# Patient Record
Sex: Female | Born: 1945 | Race: White | Hispanic: No | Marital: Married | Smoking: Never smoker
Health system: Southern US, Community
[De-identification: ages and names within clinical notes are randomized; demographics above are authoritative.]

## PROBLEM LIST (undated history)

## (undated) DIAGNOSIS — E039 Hypothyroidism, unspecified: Secondary | ICD-10-CM

## (undated) DIAGNOSIS — I10 Essential (primary) hypertension: Secondary | ICD-10-CM

## (undated) DIAGNOSIS — E079 Disorder of thyroid, unspecified: Secondary | ICD-10-CM

## (undated) DIAGNOSIS — K219 Gastro-esophageal reflux disease without esophagitis: Secondary | ICD-10-CM

## (undated) DIAGNOSIS — C50911 Malignant neoplasm of unspecified site of right female breast: Secondary | ICD-10-CM

## (undated) DIAGNOSIS — G4730 Sleep apnea, unspecified: Secondary | ICD-10-CM

## (undated) HISTORY — DX: Disorder of thyroid, unspecified: E07.9

## (undated) HISTORY — PX: TUBAL LIGATION: SHX77

## (undated) HISTORY — PX: BLADDER SURGERY: SHX569

## (undated) HISTORY — PX: CHOLECYSTECTOMY: SHX55

## (undated) HISTORY — PX: UMBILICAL HERNIA REPAIR: SHX196

## (undated) HISTORY — PX: PARTIAL HYSTERECTOMY: SHX80

## (undated) HISTORY — DX: Essential (primary) hypertension: I10

## (undated) HISTORY — DX: Gastro-esophageal reflux disease without esophagitis: K21.9

---

## 2001-07-27 ENCOUNTER — Ambulatory Visit (HOSPITAL_COMMUNITY): Admission: RE | Admit: 2001-07-27 | Discharge: 2001-07-27 | Payer: Self-pay | Admitting: Internal Medicine

## 2001-07-27 ENCOUNTER — Encounter: Payer: Self-pay | Admitting: Internal Medicine

## 2001-08-01 ENCOUNTER — Other Ambulatory Visit: Admission: RE | Admit: 2001-08-01 | Discharge: 2001-08-01 | Payer: Self-pay | Admitting: Internal Medicine

## 2002-12-18 ENCOUNTER — Encounter: Payer: Self-pay | Admitting: Internal Medicine

## 2002-12-18 ENCOUNTER — Ambulatory Visit (HOSPITAL_COMMUNITY): Admission: RE | Admit: 2002-12-18 | Discharge: 2002-12-18 | Payer: Self-pay | Admitting: Internal Medicine

## 2003-02-12 ENCOUNTER — Ambulatory Visit (HOSPITAL_COMMUNITY): Admission: RE | Admit: 2003-02-12 | Discharge: 2003-02-12 | Payer: Self-pay | Admitting: Internal Medicine

## 2003-03-12 ENCOUNTER — Encounter (INDEPENDENT_AMBULATORY_CARE_PROVIDER_SITE_OTHER): Payer: Self-pay | Admitting: Internal Medicine

## 2003-03-12 ENCOUNTER — Ambulatory Visit (HOSPITAL_COMMUNITY): Admission: RE | Admit: 2003-03-12 | Discharge: 2003-03-12 | Payer: Self-pay | Admitting: Internal Medicine

## 2003-12-13 ENCOUNTER — Other Ambulatory Visit: Admission: RE | Admit: 2003-12-13 | Discharge: 2003-12-13 | Payer: Self-pay | Admitting: Unknown Physician Specialty

## 2003-12-20 ENCOUNTER — Ambulatory Visit (HOSPITAL_COMMUNITY): Admission: RE | Admit: 2003-12-20 | Discharge: 2003-12-20 | Payer: Self-pay | Admitting: Internal Medicine

## 2004-01-03 ENCOUNTER — Ambulatory Visit (HOSPITAL_COMMUNITY): Admission: RE | Admit: 2004-01-03 | Discharge: 2004-01-03 | Payer: Self-pay | Admitting: Internal Medicine

## 2004-02-13 ENCOUNTER — Ambulatory Visit (HOSPITAL_COMMUNITY): Admission: RE | Admit: 2004-02-13 | Discharge: 2004-02-13 | Payer: Self-pay | Admitting: Neurology

## 2004-12-23 ENCOUNTER — Ambulatory Visit (HOSPITAL_COMMUNITY): Admission: RE | Admit: 2004-12-23 | Discharge: 2004-12-23 | Payer: Self-pay | Admitting: Internal Medicine

## 2005-12-28 ENCOUNTER — Ambulatory Visit (HOSPITAL_COMMUNITY): Admission: RE | Admit: 2005-12-28 | Discharge: 2005-12-28 | Payer: Self-pay | Admitting: Internal Medicine

## 2006-05-24 ENCOUNTER — Emergency Department (HOSPITAL_COMMUNITY): Admission: EM | Admit: 2006-05-24 | Discharge: 2006-05-24 | Payer: Self-pay | Admitting: Emergency Medicine

## 2007-01-03 ENCOUNTER — Ambulatory Visit (HOSPITAL_COMMUNITY): Admission: RE | Admit: 2007-01-03 | Discharge: 2007-01-03 | Payer: Self-pay | Admitting: Internal Medicine

## 2007-01-19 ENCOUNTER — Ambulatory Visit (HOSPITAL_COMMUNITY): Admission: RE | Admit: 2007-01-19 | Discharge: 2007-01-19 | Payer: Self-pay | Admitting: Internal Medicine

## 2008-01-19 ENCOUNTER — Ambulatory Visit (HOSPITAL_COMMUNITY): Admission: RE | Admit: 2008-01-19 | Discharge: 2008-01-19 | Payer: Self-pay | Admitting: Internal Medicine

## 2008-03-09 ENCOUNTER — Ambulatory Visit (HOSPITAL_COMMUNITY): Admission: RE | Admit: 2008-03-09 | Discharge: 2008-03-09 | Payer: Self-pay | Admitting: General Surgery

## 2009-01-30 ENCOUNTER — Ambulatory Visit (HOSPITAL_COMMUNITY): Admission: RE | Admit: 2009-01-30 | Discharge: 2009-01-30 | Payer: Self-pay | Admitting: Internal Medicine

## 2009-02-05 ENCOUNTER — Ambulatory Visit (HOSPITAL_COMMUNITY): Admission: RE | Admit: 2009-02-05 | Discharge: 2009-02-05 | Payer: Self-pay | Admitting: Internal Medicine

## 2010-02-03 ENCOUNTER — Ambulatory Visit (HOSPITAL_COMMUNITY): Admission: RE | Admit: 2010-02-03 | Discharge: 2010-02-03 | Payer: Self-pay | Admitting: Internal Medicine

## 2011-01-04 ENCOUNTER — Encounter: Payer: Self-pay | Admitting: Neurology

## 2011-02-04 ENCOUNTER — Other Ambulatory Visit (HOSPITAL_COMMUNITY): Payer: Self-pay | Admitting: Internal Medicine

## 2011-02-04 DIAGNOSIS — Z139 Encounter for screening, unspecified: Secondary | ICD-10-CM

## 2011-02-06 ENCOUNTER — Ambulatory Visit (HOSPITAL_COMMUNITY)
Admission: RE | Admit: 2011-02-06 | Discharge: 2011-02-06 | Disposition: A | Discharge status: Home / Self Care | Payer: BC Managed Care – PPO | Source: Ambulatory Visit | Attending: Internal Medicine | Admitting: Internal Medicine

## 2011-02-06 DIAGNOSIS — Z1231 Encounter for screening mammogram for malignant neoplasm of breast: Secondary | ICD-10-CM | POA: Insufficient documentation

## 2011-02-06 DIAGNOSIS — Z139 Encounter for screening, unspecified: Secondary | ICD-10-CM

## 2011-03-24 ENCOUNTER — Other Ambulatory Visit (HOSPITAL_COMMUNITY): Payer: Self-pay | Admitting: Internal Medicine

## 2011-03-24 ENCOUNTER — Ambulatory Visit (HOSPITAL_COMMUNITY)
Admission: RE | Admit: 2011-03-24 | Discharge: 2011-03-24 | Disposition: A | Discharge status: Home / Self Care | Payer: BC Managed Care – PPO | Source: Ambulatory Visit | Attending: Internal Medicine | Admitting: Internal Medicine

## 2011-03-24 DIAGNOSIS — M25569 Pain in unspecified knee: Secondary | ICD-10-CM

## 2011-04-28 NOTE — H&P (Signed)
[name], [name]                  ACCOUNT NO.:  [id number]   MEDICAL RECORD NO.:  [id number]          PATIENT TYPE:  AMB   LOCATION:  DAY                           FACILITY:  [facility]   PHYSICIAN:  Dalia Heading, M.D.  DATE OF BIRTH:  21-Jul-1946   DATE OF ADMISSION:  DATE OF DISCHARGE:  LH                              HISTORY & PHYSICAL   CHIEF COMPLAINT:  Incisional hernia.   HISTORY OF PRESENT ILLNESS:  The patient is a 65 year old white female  who is referred for evaluation and treatment of lower abdominal pain.  It has been occurring over the past few months.  It was made worse with  movement and coughing.  She seems to catch herself towards the right  lower portion of the abdomen, extending over to the umbilicus.  She has  had two previous surgeries through the umbilicus.   PAST MEDICAL HISTORY:  Includes:  1. Hypothyroidism.  2. Hypertension.  3. High cholesterol levels.   PAST SURGICAL HISTORY:  1. Partial hysterectomy.  2. Laparoscopic cholecystectomy.  3. Bladder tack up.  4. Tubal ligation.   CURRENT MEDICATIONS:  1. Omeprazole.  2. Synthroid.  3. Lisinopril.  4. Nabumetone.  5. Lovastatin.  6. Biotin.  7. Magnesium.  8. Baby aspirin which she is holding.   ALLERGIES:  CIPROFLOXACIN, ANCEF, PROPOFOL.   REVIEW OF SYSTEMS:  The patient denies drinking or smoking.  She denies  any recent chest pain, MI, CVA, or diabetes mellitus.   On physical examination, the patient is a well-developed, well-nourished  white female in no acute distress.  LUNGS:  Clear to auscultation with equal breath sounds bilaterally.  HEART EXAMINATION:  Reveals regular rate and rhythm without S3, S4, or  murmurs.  ABDOMEN:  Is soft, nontender, nondistended.  No hepatosplenomegaly or  masses are noted.  An umbilical hernia which is reducible is noted and  reproduces her pain.  No other hernias are noted.   IMPRESSION:  Incisional hernia, umbilical.   PLAN:  The patient is scheduled  for incisional herniorrhaphy with mesh  on March 09, 2008.  Risks and benefits of the procedure including  bleeding, infection, and recurrence of the hernia were fully explained  to the patient, who gave informed consent.      Dalia Heading, M.D.  Electronically Signed     MAJ/MEDQ  D:  02/28/2008  T:  02/29/2008  Job:  [id number]   cc:   [facility]  Fax: [phone]   Dalia Heading, M.D.  Fax: [phone]   Kingsley Callander. Ouida Sills, MD  Fax: [phone]

## 2011-04-28 NOTE — H&P (Signed)
[name], [name]                  ACCOUNT NO.:  [id number]   MEDICAL RECORD NO.:  [id number]          PATIENT TYPE:  AMB   LOCATION:  DAY                           FACILITY:  [facility]   PHYSICIAN:  Dalia Heading, M.D.  DATE OF BIRTH:  1946-01-19   DATE OF ADMISSION:  DATE OF DISCHARGE:  LH                              HISTORY & PHYSICAL   CHIEF COMPLAINT:  Incisional hernia.   HISTORY OF PRESENT ILLNESS:  The patient is a 65 year old white female  who is referred for evaluation and treatment of lower abdominal pain.  It has been occurring over the past few months.  It was made worse with  movement and coughing.  She seems to catch herself towards the right  lower portion of the abdomen, extending over to the umbilicus.  She has  had two previous surgeries through the umbilicus.   PAST MEDICAL HISTORY:  Includes:  1. Hypothyroidism.  2. Hypertension.  3. High cholesterol levels.   PAST SURGICAL HISTORY:  1. Partial hysterectomy.  2. Laparoscopic cholecystectomy.  3. Bladder tack up.  4. Tubal ligation.   CURRENT MEDICATIONS:  1. Omeprazole.  2. Synthroid.  3. Lisinopril.  4. Nabumetone.  5. Lovastatin.  6. Biotin.  7. Magnesium.  8. Baby aspirin which she is holding.   ALLERGIES:  CIPROFLOXACIN, ANCEF, PROPOFOL.   REVIEW OF SYSTEMS:  The patient denies drinking or smoking.  She denies  any recent chest pain, MI, CVA, or diabetes mellitus.   On physical examination, the patient is a well-developed, well-nourished  white female in no acute distress.  LUNGS:  Clear to auscultation with equal breath sounds bilaterally.  HEART EXAMINATION:  Reveals regular rate and rhythm without S3, S4, or  murmurs.  ABDOMEN:  Is soft, nontender, nondistended.  No hepatosplenomegaly or  masses are noted.  An umbilical hernia which is reducible is noted and  reproduces her pain.  No other hernias are noted.   IMPRESSION:  Incisional hernia, umbilical.   PLAN:  The patient is scheduled  for incisional herniorrhaphy with mesh  on March 09, 2008.  Risks and benefits of the procedure including  bleeding, infection, and recurrence of the hernia were fully explained  to the patient, who gave informed consent.      Dalia Heading, M.D.  Electronically Signed     MAJ/MEDQ  D:  02/28/2008  T:  02/29/2008  Job:  [id number]   cc:   [facility]  Fax: [phone]   Dalia Heading, M.D.  Fax: [phone]   Kingsley Callander. Ouida Sills, MD  Fax: [phone]

## 2011-04-28 NOTE — Op Note (Signed)
[name], [name]                  ACCOUNT NO.:  [id number]   MEDICAL RECORD NO.:  [id number]          PATIENT TYPE:  AMB   LOCATION:  DAY                           FACILITY:  [facility]   PHYSICIAN:  Dalia Heading, M.D.  DATE OF BIRTH:  12-Feb-1946   DATE OF PROCEDURE:  03/09/2008  DATE OF DISCHARGE:                               OPERATIVE REPORT   PREOPERATIVE DIAGNOSIS:  Incisional hernia.   POSTOPERATIVE DIAGNOSIS:  Incisional hernia.   PROCEDURE:  Incisional herniorrhaphy with mesh.   SURGEON:  Dalia Heading, M.D.   ANESTHESIA:  General endotracheal.   INDICATIONS:  The patient is a 65 year old white female who presents  with an umbilical hernia due to multiple surgeries performed through  this area.  The risks and benefits of the procedure including bleeding,  infection and recurrence of the hernia were fully explained to the  patient, who gave informed consent.   PROCEDURE NOTE:  The patient was placed in the supine position.  After  induction of general endotracheal anesthesia, the abdomen was prepped  and draped using the usual sterile technique with Betadine.  Surgical  site confirmation was performed.   An infraumbilical incision was made down to the fascia.  The umbilicus  was freed away from the underlying fascia.  Omentum was noted to be  incarcerated in the hernia sac.  The hernia sac along with some omentum  was excised without difficulty.  The defect ultimately was approximately  1-1.5 cm in its greatest diameter.  The anterior abdominal wall was  checked in this region and no other hernias were noted.  A small  Ventralex patch was then placed into this region and secured to the  fascia using 2-0 Novofil interrupted sutures.  The base of the umbilicus  was secured back to the fascia using a 2-0 Vicryl interrupted suture.  The subcutaneous layer was reapproximated using a 3-0 Vicryl interrupted  suture.  The skin was closed using a 4-0 Vicryl subcuticular  suture.  Sensorcaine 0.5% was instilled into the surrounding wound.  Dermabond  was then applied.   All tape and needle counts were correct at the end of the procedure.  The patient was extubated in the operating room and went back to the  recovery room awake, in stable condition.   COMPLICATIONS:  None.   SPECIMEN:  None.   BLOOD LOSS:  Minimal.      Dalia Heading, M.D.  Electronically Signed     MAJ/MEDQ  D:  03/09/2008  T:  03/10/2008  Job:  [id number]   cc:   Kingsley Callander. Ouida Sills, MD  Fax: [phone]

## 2011-04-29 ENCOUNTER — Other Ambulatory Visit (HOSPITAL_COMMUNITY): Payer: Self-pay | Admitting: Orthopaedic Surgery

## 2011-04-29 DIAGNOSIS — M25562 Pain in left knee: Secondary | ICD-10-CM

## 2011-05-01 NOTE — Op Note (Signed)
NAME:  [name], [name]                            ACCOUNT NO.:  [id number]   MEDICAL RECORD NO.:  [id number]                   PATIENT TYPE:  AMB   LOCATION:  DAY                                  FACILITY:  [facility]   PHYSICIAN:  Lionel December, M.D.                 DATE OF BIRTH:  February 25, 1946   DATE OF PROCEDURE:  02/12/2003  DATE OF DISCHARGE:                                 OPERATIVE REPORT   PROCEDURE:  Esophagogastroduodenoscopy with esophageal dilatation followed  by total colonoscopy, which was incomplete.   INDICATIONS FOR PROCEDURE:  The patient is a 65 year old Caucasian female  who has history of reflux esophagitis who presents with dysphagia,  intermittent chest pain.  She has been using over-the-counter medications.  She previously has been on PPI but stopped after taking it for a few months.  She is also undergoing screening colonoscopy.  The procedures were reviewed  with the patient and informed consent was obtained.  The patient was given SBE prophylaxis as there is a history of mitral valve  prolapse.  She developed red man syndrome with vancomycin towards the end.  She responded to Benadryl.   PREOPERATIVE MEDICATIONS:  Cetacaine spray for pharyngeal topical  anesthesia.  Demerol 50 mg IV, Versed 7 mg IV in divided dose.   INSTRUMENT USED:  Olympus video system.   FINDINGS:  Procedure performed in endoscopy suite.  The patient's vital  signs and O2 saturation were monitored during the procedure and remained  stable.   PROCEDURE #1 - ESOPHAGOGASTRODUODENOSCOPY:  The patient was placed in left  lateral position and the endoscope was passed through oral pharynx without  any difficulty into the esophagus.   Esophagus:  Mucosa of the proximal segment was normal.  She had small ulcers  in the mid and distal esophagus.  There was tubal narrowing into the distal  esophagus which did not appear to be critical.  She also had soft stricture  at GE junction with erosions.   There was a small sliding hiatal hernia.   Stomach:  It was empty and distended very well with insufflation.  The folds  of the proximal stomach are normal.  Examination of the mucosa at gastric  body, antrum, pyloric channel as well as angularis and fundus was normal.  The pyloric channel was patent.   Duodenum:  Exam of the bulb and second part of the duodenum was normal.   Endoscope was withdrawn.   Esophageal dilatation performed by passing 54 French Maloney dilator through  the esophagus completely.  After dilatation was complete, the endoscope was  passed again and there was mucosal disruption or a posterior tear at the mid  to distal esophagus at the site of tubal narrowing and a small tear at the  GE junction.  Pictures were taken for the record and endoscope is withdrawn  and patient prepared for colon.   PROCEDURE #  2 - TOTAL COLONOSCOPY:  Rectal examination performed.  No abnormality noted on external or digital  exam.  The scope was placed in the rectum and advanced to the region of the  sigmoid colon which was very tortuous.  A few small diverticula were noted  in the sigmoid colon.  The scope was passed into the splenic flexure but  could not advance the scope into transverse colon. although I had a very  good view of the distal transverse colon.  This scope was exchanged with  pediatric scope.  The scope once again was passed into the area of the  splenic flexure but never could pass the scope any further because it kept  looping despite trying in different positions and using abdominal pressure.  The endoscope was withdrawn.  In the segments that were examined no polyps  or tumor masses were noted.  The rectal mucosa was normal.  The scope was  retroflexed to examine the anorectal junction and hemorrhoids were noted  below the dentate line.  The endoscope straightened and withdrawn.  The  patient tolerated the procedure well.   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  Stricture also noted at gastroesophageal junction.  Small  sliding hiatal hernia.  Esophagus dilated by passing 54 [place] Maloney  dilator.   PROCEDURE #2:  1. Incomplete colonoscopy limited to splenic flexure.  A few small     diverticula were noted at sigmoid colon.  2. Small external hemorrhoids.   RECOMMENDATIONS:  She will continue antireflux measures as before.  She  needs to start Prevacid 30 mg p.o. b.i.d. and drop the dose to 30 mg q.a.m.  after two months.  Will bring her back for a barium enema in one month from  now.  I would like to see her back in the office in three months from now to  make sure her esophageal symptoms are well controlled.                                               Lionel December, M.D.    NR/MEDQ  D:  02/12/2003  T:  02/12/2003  Job:  [id number]   cc:   Kingsley Callander. Ouida Sills, M.D.  [address]  [place]  [place] [address]  Fax: [phone]

## 2011-05-01 NOTE — H&P (Signed)
[name], [name]                              ACCOUNT NO.:  [id number]   MEDICAL RECORD NO.:  [id number]                   PATIENT TYPE:  AMB   LOCATION:                                       FACILITY:  [facility]   PHYSICIAN:  Lionel December, M.D.                 DATE OF BIRTH:  1946-08-19   DATE OF ADMISSION:  02/12/2003  DATE OF DISCHARGE:                                HISTORY & PHYSICAL   PRESENTING COMPLAINT:  The patient referred for colonoscopy.  She also has  dysphagia and intermittent chest pain.   HISTORY OF PRESENT ILLNESS:  The patient is a 65 year old Caucasian female  patient of Dr. Ouida Sills who is here for a scheduled visit.  She saw Dr. Ouida Sills  recently, who recommended a screening colonoscopy.  The patient has history  of chronic chest pain.  I saw her back in March 2002.  She had  esophagogastroduodenoscopy and she had a single ulcer at the distal  esophagus and a small sliding hiatal hernia.  She was treated with PPI  initially b.i.d. and then daily.  She noted significant improvement but her  chest pain never did go away completely.  She ran out of the prescription  and decided not to call for a refill.  She generally takes 2-3 Tums every  day.  She has chest pain almost every week.  It does not last too long and  it is not associated with other symptoms.  She also complains of dysphagia  which is experienced every week primarily with meat, apples, and noodles.  She denies chronic hoarseness, cough, odynophagia, shortness of breath,  melena, or rectal bleeding.  Her bowels move regularly.  She has a good  appetite.  She has lost about 10 pounds in the last one year but she has  been trying to do so.   MEDICATIONS:  1. Synthroid 100 mcg daily.  2. Calcium 1 g daily.  3. Lisinopril 20/25 daily.  4. Tums 2-3 tablets daily.  5. Viactin, vitamin C, vitamin E, biotin, and magnesium daily.   PAST MEDICAL HISTORY:  History of mitral valve prolapse; however, one  cardiologist a few years ago told her that he was not convinced.  This was  an echocardiogram diagnosis about 14 years ago.  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   PAST SURGICAL HISTORY:  Vaginal hysterectomy with repair of rectocele in  1995 and laparoscopic cholecystectomy in May 1998.  She had noninvasive  cardiac evaluation less than three years ago which was within normal limits.  Her last EGD was in March 2002 and she also had one in January 1999  revealing erosive reflux esophagitis with scar in distal esophagus, as well  as gastritis, but CLOtest was negative.   ALLERGIES:  1. ANCEF.  2. CEPHALOSPORIN.  3. CIPRO.  4. PROPOFOL.  FAMILY HISTORY:  Both parents are deceased.  Mother died of heart disease at  age 86 and father of emphysema at age 32.  One brother has had CABG.  He is  57.  Another brother has CAD.  Two sisters have thyroid disease and one has  diabetes mellitus.   SOCIAL HISTORY:  She is married and has three healthy children.  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  She does not smoke cigarettes or drink alcohol.   PHYSICAL EXAMINATION:  GENERAL:  A pleasant, well-developed, well-nourished  Caucasian female who is in no acute distress.  VITAL SIGNS:  She weighs 149 pounds.  She is 5 feet 1 inch tall.  Pulse 80  per minute, blood pressure 140/70.  She is afebrile.  HEENT:  Conjunctiva is pink.  Sclera is non-icteric.  Oropharyngeal mucosa  is normal.  NECK:  Without masses or thyromegaly.  Carotids are 2+ bilaterally without  bruits.  CARDIAC:  Regular rhythm.  Normal S1, S2.  Short systolic murmur noted at  LLSB and aortic area but no click present.  LUNGS:  Clear to auscultation.  ABDOMEN:  Full, soft, and nontender without organomegaly or masses.  RECTAL:  Exam is deferred.  EXTREMITIES:  No clubbing or edema noted.   ASSESSMENT:  1. She is  average risk for colorectal carcinoma.  I agree she should have a     screening colonoscopy, given her age.  2. The patient has intermittent solid food dysphagia and chest pain.     Previous gastrointestinal workup revealed erosive/ulcerative reflux     esophagitis.  She is presently on no medication other than over-the-     counter Tums.  She easily could have a stricture or ring formation.   RECOMMENDATIONS:  1. She will continue antireflux measures as before.  I have given her     samples of Prevacid 30 mg but I have asked her to hold off until     endoscopic evaluation.  2. Esophagogastroduodenoscopy with ED and total colonoscopy to be performed     at [facility] in the near future.  I have reviewed both of the     procedure risks with the patient and she is agreeable.  Further     recommendations regarding therapy for her chronic gastroesophageal reflux     disease will be made at the time of endoscopy.   I would like to thank Dr. Ouida Sills for the opportunity to participate in the  care of this nice lady.                                               Lionel December, M.D.    NR/MEDQ  D:  01/17/2003  T:  01/17/2003  Job:  [id number]   cc:   Kingsley Callander. Ouida Sills, M.D.  [address]  [place]  [place] [address]  Fax: [phone]

## 2011-05-04 ENCOUNTER — Ambulatory Visit (HOSPITAL_COMMUNITY)
Admission: RE | Admit: 2011-05-04 | Discharge: 2011-05-04 | Disposition: A | Discharge status: Home / Self Care | Payer: BC Managed Care – PPO | Source: Ambulatory Visit | Attending: Orthopaedic Surgery | Admitting: Orthopaedic Surgery

## 2011-05-04 DIAGNOSIS — M7120 Synovial cyst of popliteal space [Baker], unspecified knee: Secondary | ICD-10-CM | POA: Insufficient documentation

## 2011-05-04 DIAGNOSIS — M25569 Pain in unspecified knee: Secondary | ICD-10-CM | POA: Insufficient documentation

## 2011-05-04 DIAGNOSIS — M84369A Stress fracture, unspecified tibia and fibula, initial encounter for fracture: Secondary | ICD-10-CM | POA: Insufficient documentation

## 2011-05-04 DIAGNOSIS — M25562 Pain in left knee: Secondary | ICD-10-CM

## 2011-05-04 DIAGNOSIS — X58XXXA Exposure to other specified factors, initial encounter: Secondary | ICD-10-CM | POA: Insufficient documentation

## 2011-05-04 DIAGNOSIS — M23329 Other meniscus derangements, posterior horn of medial meniscus, unspecified knee: Secondary | ICD-10-CM | POA: Insufficient documentation

## 2011-09-07 LAB — CBC
HCT: 42.4
Hemoglobin: 14.9
MCV: 85.6
RBC: 4.96
WBC: 11.3 — ABNORMAL HIGH

## 2011-09-07 LAB — BASIC METABOLIC PANEL
Chloride: 102
GFR calc Af Amer: >60
GFR calc non Af Amer: >60
Potassium: 3.7
Sodium: 137

## 2011-10-12 ENCOUNTER — Other Ambulatory Visit (HOSPITAL_COMMUNITY): Payer: Self-pay | Admitting: Internal Medicine

## 2011-10-12 ENCOUNTER — Ambulatory Visit (HOSPITAL_COMMUNITY)
Admission: RE | Admit: 2011-10-12 | Discharge: 2011-10-12 | Disposition: A | Discharge status: Home / Self Care | Payer: BC Managed Care – PPO | Source: Ambulatory Visit | Attending: Internal Medicine | Admitting: Internal Medicine

## 2011-10-12 DIAGNOSIS — M545 Low back pain, unspecified: Secondary | ICD-10-CM | POA: Insufficient documentation

## 2011-10-12 DIAGNOSIS — IMO0002 Reserved for concepts with insufficient information to code with codable children: Secondary | ICD-10-CM

## 2011-10-12 DIAGNOSIS — M4120 Other idiopathic scoliosis, site unspecified: Secondary | ICD-10-CM | POA: Insufficient documentation

## 2012-02-10 ENCOUNTER — Other Ambulatory Visit (HOSPITAL_COMMUNITY): Payer: Self-pay | Admitting: Internal Medicine

## 2012-02-10 DIAGNOSIS — Z139 Encounter for screening, unspecified: Secondary | ICD-10-CM

## 2012-02-12 ENCOUNTER — Ambulatory Visit (HOSPITAL_COMMUNITY)
Admission: RE | Admit: 2012-02-12 | Discharge: 2012-02-12 | Disposition: A | Discharge status: Home / Self Care | Payer: BC Managed Care – PPO | Source: Ambulatory Visit | Attending: Internal Medicine | Admitting: Internal Medicine

## 2012-02-12 DIAGNOSIS — Z139 Encounter for screening, unspecified: Secondary | ICD-10-CM

## 2012-02-12 DIAGNOSIS — Z1231 Encounter for screening mammogram for malignant neoplasm of breast: Secondary | ICD-10-CM | POA: Insufficient documentation

## 2012-06-01 ENCOUNTER — Ambulatory Visit (INDEPENDENT_AMBULATORY_CARE_PROVIDER_SITE_OTHER): Payer: BC Managed Care – PPO | Admitting: Orthopedic Surgery

## 2012-06-01 ENCOUNTER — Encounter: Payer: Self-pay | Admitting: Orthopedic Surgery

## 2012-06-01 VITALS — BP 120/76 | Ht 60.0 in | Wt 160.0 lb

## 2012-06-01 DIAGNOSIS — M1710 Unilateral primary osteoarthritis, unspecified knee: Secondary | ICD-10-CM

## 2012-06-01 DIAGNOSIS — M1712 Unilateral primary osteoarthritis, left knee: Secondary | ICD-10-CM

## 2012-06-01 DIAGNOSIS — M84369A Stress fracture, unspecified tibia and fibula, initial encounter for fracture: Secondary | ICD-10-CM

## 2012-06-01 DIAGNOSIS — M23329 Other meniscus derangements, posterior horn of medial meniscus, unspecified knee: Secondary | ICD-10-CM

## 2012-06-01 DIAGNOSIS — M84362A Stress fracture, left tibia, initial encounter for fracture: Secondary | ICD-10-CM

## 2012-06-01 NOTE — Patient Instructions (Addendum)
Arthritis of the knee Torn meniscus  Stress reaction/fracture   Wear brace x 6 weeks

## 2012-06-06 ENCOUNTER — Encounter: Payer: Self-pay | Admitting: Orthopedic Surgery

## 2012-06-06 DIAGNOSIS — M23329 Other meniscus derangements, posterior horn of medial meniscus, unspecified knee: Secondary | ICD-10-CM | POA: Insufficient documentation

## 2012-06-06 DIAGNOSIS — M84362A Stress fracture, left tibia, initial encounter for fracture: Secondary | ICD-10-CM | POA: Insufficient documentation

## 2012-06-06 DIAGNOSIS — M1712 Unilateral primary osteoarthritis, left knee: Secondary | ICD-10-CM | POA: Insufficient documentation

## 2012-06-06 NOTE — Progress Notes (Signed)
  Subjective:    [name] is a 66 y.o. female who presents with the chief complaint of left knee pain for 3 months  The patient reports sudden onset of pain in her left knee which was sharp. She rated the pain 5/10. The timing of the pain was constant it was worse when she was on her feet. It is better with a knee sleeve and worse when walking on it. Other symptoms and signs include swelling and a catching sensation. She received one injection of steroids in the joint without relief. The following portions of the patient's history were reviewed and updated as appropriate: allergies, current medications, past family history, past medical history, past social history, past surgical history and problem list.   Review of Systems Pertinent items are noted in HPI.   Objective:    BP 120/76  Ht 5' (1.524 m)  Wt 72.576 kg (160 lb)  BMI 31.25 kg/m2  Her appearance was well-groomed she is oriented x3 her mood was normal she had a slight limp  Her upper  extremities show no contracture subluxation atrophy tremor and skin was normal with good pulses and sensation and no lymphadenopathy Right knee: normal and no effusion, full active range of motion, no joint line tenderness, ligamentous structures intact.  Left knee:  The joint line is tender over the medial compartment and medial joint line. Range of motion is normal the knee is stable. The strength and muscle tone normal the scans normal. She has good distal pulses no lymphadenopathy and normal sensation in both limbs reflexes are normal with no pathologic reflexes elicited coordination was normal.   X-ray MRI and plain films show that she has stress reaction in the medial tibia consistent with arthritic rhinitis and possible stress fracture there is medial meniscal tear as well there are degenerative changes: no fracture, dislocation, swelling or degenerative changes noted and     Assessment:    Left Stress reaction, medial meniscal tear,  osteoarthritis.    Plan:    Left knee brace with hinges to unload the joint I think if she doesn't have major mechanical symptoms if the stress fractures stress reaction heals this will be all that is necessary, if she's not better than we can scope the knee

## 2012-07-20 ENCOUNTER — Encounter: Payer: Self-pay | Admitting: Orthopedic Surgery

## 2012-07-20 ENCOUNTER — Ambulatory Visit (INDEPENDENT_AMBULATORY_CARE_PROVIDER_SITE_OTHER): Payer: BC Managed Care – PPO | Admitting: Orthopedic Surgery

## 2012-07-20 VITALS — BP 150/70 | Ht 60.0 in | Wt 158.0 lb

## 2012-07-20 DIAGNOSIS — M23329 Other meniscus derangements, posterior horn of medial meniscus, unspecified knee: Secondary | ICD-10-CM

## 2012-07-20 DIAGNOSIS — M84362A Stress fracture, left tibia, initial encounter for fracture: Secondary | ICD-10-CM

## 2012-07-20 DIAGNOSIS — M84369A Stress fracture, unspecified tibia and fibula, initial encounter for fracture: Secondary | ICD-10-CM

## 2012-07-20 NOTE — Patient Instructions (Addendum)
You have been scheduled for arthroscocpic knee surgery.  All surgeries carry some risk.  Remember you always have the option of continued nonsurgical treatment. However in this situation the risks vs. the benefits favor surgery as the best treatment option. The risks of the surgery includes the following but is not limited to bleeding, infection, pulmonary embolus, death from anesthesia, nerve injury vascular injury or need for further surgery, continued pain.  Specific to this procedure the following risks and complications are rare but possible Stiffness, pain, weakness, giving out  I expect  recovery will be in 3-4 weeks some patients take 6 weeks.  You  will need physical therapy after the procedure  You have been scheduled for surgery  Please Go to your preoperative appointment and bring the folder that was given to you today  Please stop all blood thinners ibuprofen Naprosyn aspirin Plavix Coumadin  Meniscus Injury of the Knee, Arthroscopy You may have an internal derangement of the knee. This means something is wrong inside the knee. Your caregiver can make a more accurate diagnosis (learning what is wrong) by performing an arthroscopic procedure. Your knee has two layers of cartilage. Articular cartilage covers the bone ends. It lets your knee bend and move smoothly. Two menisci (thick pads of cartilage that form a rim inside the joint) help absorb shock. They stabilize your knee. Ligaments bind the bones together. They support your knee joint. Muscles move the joint, help support your knee, and take stress off the joint itself.   ABOUT THE PROCEDURE Arthroscopy is a surgical technique. It allows your orthopedic surgeon to diagnose and treat your knee injury with accuracy. The surgeon looks into your knee through a small scope. The scope is like a small (pencil-sized) telescope. Arthroscopy is less invasive than open knee surgery. You can expect a more rapid recovery. Following your  caregiver's instructions will help you recover rapidly and completely. Use crutches, rest, elevate, ice, and do knee exercises as instructed. The length of recovery depends on various factors. These factors include type of injury, age, physical condition, medical conditions, and your determination. How long you will be away from your normal activities will depend on what kind of knee problem you have. It will also depend on how much tissue is damaged. Rebuilding your muscles after arthroscopy helps ensure a full recovery. RECOVERY Recovery after a meniscus injury depends on how much meniscus is damaged. It also depends on whether or not you have damaged other knee tissue. With small tears, your recovery may take a couple weeks. Larger tears will take longer. Meniscus injuries can usually be treated during arthroscopy. If your injury is on the inner edge of the meniscus, your surgeon may trim the meniscus back to a smooth rim. In other cases, your surgeon will try to repair a damaged meniscus with sutures (stitches). This may lengthen your rehabilitation. It may provide better long-term health by helping your knee retain its shock absorption abilities. Use crutches, limit weight bearing, rest, elevate, apply ice, and exercise your knee as instructed. If a brace is applied, use as directed. The length of recovery depends on various factors including type of injury, age, physical condition, other medical conditions, and your determination. Your caregiver will help with instructions for rehabilitation of your knee. HOME CARE INSTRUCTIONS  Use crutches and knee exercises as instructed.   Applying an ice pack to your operative site may help with discomfort. It may also keep the swelling down.   Only take over-the-counter or prescription  medicines for pain, discomfort, inflammation (soreness)or fever as directed by your caregiver. You may use these only if your caregiver has not given medications that would  interfere.   You may resume normal diet and activities as directed.  SEEK MEDICAL ATTENTION IF:  There is increased bleeding (more than a small spot) from the wound.   You notice redness, swelling, or increasing pain in the wound.   Pus is coming from wound.   An unexplained oral temperature above 102 F (38.9 C) develops, or as your caregiver suggests.   You notice a foul smell coming from the wound or dressing.  SEEK IMMEDIATE MEDICAL CARE IF:  You develop a rash.   You have difficulty breathing.   You have any allergic problems.  Document Released: 11/27/2000 Document Revised: 11/19/2011 Document Reviewed: 02/13/2008 [facility] Patient Information 2012 [place], [place].

## 2012-07-20 NOTE — Progress Notes (Signed)
Patient ID: [name], female   DOB: [birth year], 66 y.o.   MRN: [id number] Chief Complaint  Patient presents with  . Follow-up    left knee and left tibia stress fracture, DOI 02/2011    BP 150/70  Ht 5' (1.524 m)  Wt 158 lb (71.668 kg)  BMI 30.86 kg/m2  Returns status post MRI documented LEFT tibial stress fracture with medial meniscal tear.  Braced for 6 weeks with no major improvement. Still complains of fairly significant medial pain.  She is a positive McMurray sign and tenderness over the medial compartment.  Recommend medial meniscectomy.  Probably will continue bracing after until the stress fracture heals.  Risks and benefits of surgery explained and the patient confirms understanding of procedure risks, benefits, and postoperative care  History and physical will be completed at a later date and is incorporated by reference

## 2012-07-22 ENCOUNTER — Telehealth: Payer: Self-pay | Admitting: Orthopedic Surgery

## 2012-07-22 NOTE — Telephone Encounter (Signed)
Contacted insurer, [place], [place] [phone], and direct to [facility], [phone], regarding out-patient surgery scheduled 07/29/12, [facility], [place] [address], [phone].   * Reached representative Tillie Fantasia, who relays that no pre-authorization is required for in-network providers.  His name and date for reference.

## 2012-07-25 ENCOUNTER — Encounter (HOSPITAL_COMMUNITY)
Admission: RE | Admit: 2012-07-25 | Discharge: 2012-07-25 | Disposition: A | Discharge status: Home / Self Care | Payer: BC Managed Care – PPO | Source: Ambulatory Visit | Attending: Orthopedic Surgery | Admitting: Orthopedic Surgery

## 2012-07-25 ENCOUNTER — Encounter (HOSPITAL_COMMUNITY): Payer: Self-pay

## 2012-07-25 HISTORY — DX: Hypothyroidism, unspecified: E03.9

## 2012-07-25 HISTORY — DX: Sleep apnea, unspecified: G47.30

## 2012-07-25 LAB — BASIC METABOLIC PANEL
CO2: 26 mEq/L (ref 19–32)
Calcium: 10 mg/dL (ref 8.4–10.5)
Creatinine, Ser: 0.72 mg/dL (ref 0.50–1.10)
Glucose, Bld: 90 mg/dL (ref 70–99)

## 2012-07-25 MED ORDER — CHLORHEXIDINE GLUCONATE 4 % EX LIQD: 60.0000 mL | Freq: Once | CUTANEOUS | Status: DC

## 2012-07-25 NOTE — Patient Instructions (Signed)
20 [name]  07/25/2012   Your procedure is scheduled on:  07/29/12  Report to [facility] at 10:00 AM.  Call this number if you have problems the morning of surgery: [phone]   Remember:   Do not eat or drink:After Midnight.  Take these medicines the morning of surgery with A SIP OF WATER: Levothyroxine, Lisinopril-HCTZ, Omeprazole   Do not wear jewelry, make-up or nail polish.  Do not wear lotions, powders, or perfumes. You may wear deodorant.  Do not shave 48 hours prior to surgery. Men may shave face and neck.  Do not bring valuables to the hospital.  Contacts, dentures or bridgework may not be worn into surgery.  Leave suitcase in the car. After surgery it may be brought to your room.  For patients admitted to the hospital, checkout time is 11:00 AM the day of discharge.   Patients discharged the day of surgery will not be allowed to drive home.  Special Instructions: CHG Shower Use Special Wash: 1/2 bottle night before surgery and 1/2 bottle morning of surgery.   Please read over the following fact sheets that you were given: Pain Booklet, MRSA Information, Surgical Site Infection Prevention, Anesthesia Post-op Instructions and Care and Recovery After Surgery     Meniscus Injury of the Knee, Arthroscopy You may have an internal derangement of the knee. This means something is wrong inside the knee. Your caregiver can make a more accurate diagnosis (learning what is wrong) by performing an arthroscopic procedure. Your knee has two layers of cartilage. Articular cartilage covers the bone ends. It lets your knee bend and move smoothly. Two menisci (thick pads of cartilage that form a rim inside the joint) help absorb shock. They stabilize your knee. Ligaments bind the bones together. They support your knee joint. Muscles move the joint, help support your knee, and take stress off the joint itself.  ABOUT THE PROCEDURE Arthroscopy is a surgical technique. It allows your orthopedic  surgeon to diagnose and treat your knee injury with accuracy. The surgeon looks into your knee through a small scope. The scope is like a small (pencil-sized) telescope. Arthroscopy is less invasive than open knee surgery. You can expect a more rapid recovery. Following your caregiver's instructions will help you recover rapidly and completely. Use crutches, rest, elevate, ice, and do knee exercises as instructed. The length of recovery depends on various factors. These factors include type of injury, age, physical condition, medical conditions, and your determination. How long you will be away from your normal activities will depend on what kind of knee problem you have. It will also depend on how much tissue is damaged. Rebuilding your muscles after arthroscopy helps ensure a full recovery. RECOVERY Recovery after a meniscus injury depends on how much meniscus is damaged. It also depends on whether or not you have damaged other knee tissue. With small tears, your recovery may take a couple weeks. Larger tears will take longer. Meniscus injuries can usually be treated during arthroscopy. If your injury is on the inner edge of the meniscus, your surgeon may trim the meniscus back to a smooth rim. In other cases, your surgeon will try to repair a damaged meniscus with sutures (stitches). This may lengthen your rehabilitation. It may provide better long-term health by helping your knee retain its shock absorption abilities. Use crutches, limit weight bearing, rest, elevate, apply ice, and exercise your knee as instructed. If a brace is applied, use as directed. The length of recovery depends on  various factors including type of injury, age, physical condition, other medical conditions, and your determination. Your caregiver will help with instructions for rehabilitation of your knee. HOME CARE INSTRUCTIONS  Use crutches and knee exercises as instructed.   Applying an ice pack to your operative site may help  with discomfort. It may also keep the swelling down.   Only take over-the-counter or prescription medicines for pain, discomfort, inflammation (soreness)or fever as directed by your caregiver. You may use these only if your caregiver has not given medications that would interfere.   You may resume normal diet and activities as directed.  SEEK MEDICAL ATTENTION IF:  There is increased bleeding (more than a small spot) from the wound.   You notice redness, swelling, or increasing pain in the wound.   Pus is coming from wound.   An unexplained oral temperature above 102 F (38.9 C) develops, or as your caregiver suggests.   You notice a foul smell coming from the wound or dressing.  SEEK IMMEDIATE MEDICAL CARE IF:  You develop a rash.   You have difficulty breathing.   You have any allergic problems.  Document Released: 11/27/2000 Document Revised: 11/19/2011 Document Reviewed: 02/13/2008 [facility] Patient Information 2012 [place], [place].   PATIENT INSTRUCTIONS POST-ANESTHESIA  IMMEDIATELY FOLLOWING SURGERY:  Do not drive or operate machinery for the first twenty four hours after surgery.  Do not make any important decisions for twenty four hours after surgery or while taking narcotic pain medications or sedatives.  If you develop intractable nausea and vomiting or a severe headache please notify your doctor immediately.  FOLLOW-UP:  Please make an appointment with your surgeon as instructed. You do not need to follow up with anesthesia unless specifically instructed to do so.  WOUND CARE INSTRUCTIONS (if applicable):  Keep a dry clean dressing on the anesthesia/puncture wound site if there is drainage.  Once the wound has quit draining you may leave it open to air.  Generally you should leave the bandage intact for twenty four hours unless there is drainage.  If the epidural site drains for more than 36-48 hours please call the anesthesia department.  QUESTIONS?:  Please feel free  to call your physician or the hospital operator if you have any questions, and they will be happy to assist you.

## 2012-07-25 NOTE — Progress Notes (Signed)
07/25/12 1112  OBSTRUCTIVE SLEEP APNEA  Have you ever been diagnosed with sleep apnea through a sleep study? No  Do you snore loudly (loud enough to be heard through closed doors)?  1  Do you often feel tired, fatigued, or sleepy during the daytime? 0  Has anyone observed you stop breathing during your sleep? 1  Do you have, or are you being treated for high blood pressure? 1  BMI more than 35 kg/m2? 0  Age over 68 years old? 1  Neck circumference greater than 40 cm/18 inches? 0  Obstructive Sleep Apnea Score 4   Score 4 or greater  Updated health history;Results sent to PCP

## 2012-07-28 NOTE — H&P (Signed)
[name] is an 66 y.o. female.   Chief Complaint: LEFT KNEE PAIN  HPI: 66 YO FEMALE SEVERE MEDIAL KNEE PAIN AFTER KNEE INJURY. SHE FAILED CONSERVATIVE NON OPERATIVE TREATMENT WITH BRACING AND MEDICATION  SHE CONTINUES TO HAVE MEDIAL KNEE PAIN AND MECHANICAL SYMPTOMS. THIS HAS BECOME UNSATISFACTORY FOR HER AND SHE WOULD LIKE TO PROCEED WITH SURGICAL TREATMENT VS CONTINUE NOP OPERTAIVE CARE   Past Medical History  Diagnosis Date  . HTN (hypertension)   . Thyroid disease   . Acid reflux   . Hypothyroidism   . Sleep apnea     Stop [name] score of 4    Past Surgical History  Procedure Date  . Cholecystectomy   . Bladder surgery   . Partial hysterectomy   . Umbilical hernia repair   . Tubal ligation     Family History  Problem Relation Age of Onset  . Heart disease Mother   . Emphysema Father   . Breast cancer Sister   . Heart disease Brother   . Heart disease Brother    Social History:  reports that she has never smoked. She does not have any smokeless tobacco history on file. She reports that she does not drink alcohol or use illicit drugs.  Allergies:  Allergies  Allergen Reactions  . Ancef (Cefazolin) Anaphylaxis  . Ciprofloxacin Anaphylaxis    No prescriptions prior to admission   No current facility-administered medications on file prior to encounter.   Current Outpatient Prescriptions on File Prior to Encounter  Medication Sig Dispense Refill  . aspirin 81 MG tablet Take 81 mg by mouth at bedtime.       . B Complex Vitamins (B-COMPLEX/B-12 SL) Place 1 tablet under the tongue every morning.       . Calcium-Vitamin D-Vitamin K (VIACTIV PO) Take 1 tablet by mouth every morning.       Marland Kitchen levothyroxine (SYNTHROID, LEVOTHROID) 75 MCG tablet Take 75 mcg by mouth daily before breakfast.       . lisinopril-hydrochlorothiazide (PRINZIDE,ZESTORETIC) 20-12.5 MG per tablet Take 1 tablet by mouth every morning.       . lovastatin (MEVACOR) 20 MG tablet Take 20 mg by mouth  at bedtime.      Marland Kitchen omeprazole (PRILOSEC) 20 MG capsule Take 20 mg by mouth every morning.          No results found for this or any previous visit (from the past 48 hour(s)). No results found.  Review of Systems  All other systems reviewed and are negative.    There were no vitals taken for this visit. Physical Exam  Constitutional: She is oriented to person, place, and time. She appears well-developed and well-nourished.  HENT:  Head: Normocephalic.  Eyes: Pupils are equal, round, and reactive to light.  Neck: Normal range of motion.  Cardiovascular: Normal rate and intact distal pulses.   Respiratory: She is in respiratory distress.  GI: She exhibits no distension.  Lymphadenopathy:    She has no cervical adenopathy.  Neurological: She is alert and oriented to person, place, and time. She has normal reflexes.  Skin: Skin is warm and dry.  Psychiatric: She has a normal mood and affect. Her behavior is normal. Judgment and thought content normal.   Right Knee Exam  Right knee exam is normal.   Left Knee Exam   Tenderness  The patient is experiencing tenderness in the medial joint line.  Range of Motion  The patient has normal left knee ROM.  Muscle Strength   The patient has normal left knee strength.  Tests  McMurray:  Medial - positive  Lachman:  Anterior - negative     Drawer:       Anterior - negative     Posterior - negative Varus: negative Valgus: negative Patellar Apprehension: negative  Other  Erythema: absent Scars: absent Sensation: normal Pulse: present Swelling: mild   Right Hip Exam  Right hip exam is normal.    Left Hip Exam  Left hip exam is normal.   Right Hand Exam  Right hand exam is normal.   Left Hand Exam  Left hand exam is normal.   Right Elbow Exam  Right elbow exam is normal.   Left Elbow Exam  Left elbow exam is normal.   Right Shoulder Exam  Right shoulder exam is normal.   Left Shoulder Exam  Left  shoulder exam is normal.      Assessment/Plan  IMPRESSION:  1. Small subcortical stress fracture along the anteromedial  portion of the medial tibial plateau, with adjacent marrow edema.  2. Radial tear of the posterior horn of the medial meniscus  adjacent to the meniscal root.  3. Small Baker's cyst and trace knee effusion.  4. Osteoarthritis of the knee, with considerable articular  cartilage loss in the medial compartment and moderate articular  cartilage thinning in the patellofemoral joint.  Original Report Authenticated By: Dellia Cloud, M.D.  Plan SALK PARTIAL MEDIAL MENISECTOMY  [name] 07/28/2012, 10:56 PM

## 2012-07-29 ENCOUNTER — Ambulatory Visit (HOSPITAL_COMMUNITY)
Admission: RE | Admit: 2012-07-29 | Discharge: 2012-07-29 | Disposition: A | Discharge status: Home / Self Care | Payer: BC Managed Care – PPO | Source: Ambulatory Visit | Attending: Orthopedic Surgery | Admitting: Orthopedic Surgery

## 2012-07-29 ENCOUNTER — Encounter (HOSPITAL_COMMUNITY): Payer: Self-pay | Admitting: Anesthesiology

## 2012-07-29 ENCOUNTER — Encounter (HOSPITAL_COMMUNITY)
Admission: RE | Disposition: A | Discharge status: Home / Self Care | Payer: Self-pay | Source: Ambulatory Visit | Attending: Orthopedic Surgery

## 2012-07-29 ENCOUNTER — Encounter (HOSPITAL_COMMUNITY): Payer: Self-pay | Admitting: *Deleted

## 2012-07-29 ENCOUNTER — Ambulatory Visit (HOSPITAL_COMMUNITY): Payer: BC Managed Care – PPO | Admitting: Anesthesiology

## 2012-07-29 DIAGNOSIS — Z01810 Encounter for preprocedural cardiovascular examination: Secondary | ICD-10-CM | POA: Insufficient documentation

## 2012-07-29 DIAGNOSIS — M1712 Unilateral primary osteoarthritis, left knee: Secondary | ICD-10-CM

## 2012-07-29 DIAGNOSIS — S83259A Bucket-handle tear of lateral meniscus, current injury, unspecified knee, initial encounter: Secondary | ICD-10-CM

## 2012-07-29 DIAGNOSIS — I10 Essential (primary) hypertension: Secondary | ICD-10-CM | POA: Insufficient documentation

## 2012-07-29 DIAGNOSIS — M23349 Other meniscus derangements, anterior horn of lateral meniscus, unspecified knee: Secondary | ICD-10-CM | POA: Insufficient documentation

## 2012-07-29 DIAGNOSIS — M23329 Other meniscus derangements, posterior horn of medial meniscus, unspecified knee: Secondary | ICD-10-CM

## 2012-07-29 DIAGNOSIS — IMO0002 Reserved for concepts with insufficient information to code with codable children: Secondary | ICD-10-CM | POA: Insufficient documentation

## 2012-07-29 DIAGNOSIS — M2240 Chondromalacia patellae, unspecified knee: Secondary | ICD-10-CM | POA: Insufficient documentation

## 2012-07-29 DIAGNOSIS — M23302 Other meniscus derangements, unspecified lateral meniscus, unspecified knee: Secondary | ICD-10-CM

## 2012-07-29 DIAGNOSIS — M84362A Stress fracture, left tibia, initial encounter for fracture: Secondary | ICD-10-CM

## 2012-07-29 DIAGNOSIS — Z01812 Encounter for preprocedural laboratory examination: Secondary | ICD-10-CM | POA: Insufficient documentation

## 2012-07-29 DIAGNOSIS — Z79899 Other long term (current) drug therapy: Secondary | ICD-10-CM | POA: Insufficient documentation

## 2012-07-29 DIAGNOSIS — M1710 Unilateral primary osteoarthritis, unspecified knee: Secondary | ICD-10-CM

## 2012-07-29 DIAGNOSIS — M898X9 Other specified disorders of bone, unspecified site: Secondary | ICD-10-CM | POA: Insufficient documentation

## 2012-07-29 SURGERY — ARTHROSCOPY, KNEE, WITH LATERAL MENISCECTOMY
Anesthesia: General | Site: Knee | Laterality: Left | Wound class: Clean

## 2012-07-29 MED ORDER — CELECOXIB 100 MG PO CAPS
ORAL_CAPSULE | ORAL | Status: AC
  Filled 2012-07-29: qty 4

## 2012-07-29 MED ORDER — MIDAZOLAM HCL 2 MG/2ML IJ SOLN
INTRAMUSCULAR | Status: AC
  Filled 2012-07-29: qty 2

## 2012-07-29 MED ORDER — SODIUM CHLORIDE 0.9 % IR SOLN
Status: DC | PRN
  Administered 2012-07-29 (×2)

## 2012-07-29 MED ORDER — GLYCOPYRROLATE 0.2 MG/ML IJ SOLN
INTRAMUSCULAR | Status: AC
  Filled 2012-07-29: qty 1

## 2012-07-29 MED ORDER — LACTATED RINGERS IV SOLN
INTRAVENOUS | Status: DC
  Administered 2012-07-29: 11:00:00 via INTRAVENOUS

## 2012-07-29 MED ORDER — BUPIVACAINE-EPINEPHRINE PF 0.5-1:200000 % IJ SOLN
INTRAMUSCULAR | Status: DC | PRN
  Administered 2012-07-29: 60 mL

## 2012-07-29 MED ORDER — FENTANYL CITRATE 0.05 MG/ML IJ SOLN
INTRAMUSCULAR | Status: AC
  Filled 2012-07-29: qty 2

## 2012-07-29 MED ORDER — EPINEPHRINE HCL 1 MG/ML IJ SOLN
INTRAMUSCULAR | Status: AC
  Filled 2012-07-29: qty 5

## 2012-07-29 MED ORDER — PROPOFOL 10 MG/ML IV BOLUS
INTRAVENOUS | Status: DC | PRN
  Administered 2012-07-29: 40 mg via INTRAVENOUS
  Administered 2012-07-29: 130 mg via INTRAVENOUS

## 2012-07-29 MED ORDER — VANCOMYCIN HCL IN DEXTROSE 1-5 GM/200ML-% IV SOLN
1000.0000 mg | INTRAVENOUS | Status: AC
  Administered 2012-07-29 (×2): 1000 mg via INTRAVENOUS

## 2012-07-29 MED ORDER — MIDAZOLAM HCL 2 MG/2ML IJ SOLN
1.0000 mg | INTRAMUSCULAR | Status: DC | PRN
  Administered 2012-07-29 (×2): 1 mg via INTRAVENOUS

## 2012-07-29 MED ORDER — TRAMADOL HCL 50 MG PO TABS
ORAL_TABLET | ORAL | Status: AC
  Filled 2012-07-29: qty 1

## 2012-07-29 MED ORDER — GLYCOPYRROLATE 0.2 MG/ML IJ SOLN
0.2000 mg | Freq: Once | INTRAMUSCULAR | Status: AC
  Administered 2012-07-29: 0.2 mg via INTRAVENOUS

## 2012-07-29 MED ORDER — LIDOCAINE HCL (PF) 1 % IJ SOLN
INTRAMUSCULAR | Status: AC
Start: 2012-07-29 — End: 2012-07-29
  Filled 2012-07-29: qty 5

## 2012-07-29 MED ORDER — SODIUM CHLORIDE 0.9 % IR SOLN
Status: DC | PRN
Start: 2012-07-29 — End: 2012-07-29
  Administered 2012-07-29: 1000 mL

## 2012-07-29 MED ORDER — LIDOCAINE HCL 1 % IJ SOLN
INTRAMUSCULAR | Status: DC | PRN
  Administered 2012-07-29: 40 mg via INTRADERMAL

## 2012-07-29 MED ORDER — ONDANSETRON HCL 4 MG/2ML IJ SOLN
4.0000 mg | Freq: Once | INTRAMUSCULAR | Status: AC | PRN
  Administered 2012-07-29: 4 mg via INTRAVENOUS

## 2012-07-29 MED ORDER — FENTANYL CITRATE 0.05 MG/ML IJ SOLN
INTRAMUSCULAR | Status: DC | PRN
  Administered 2012-07-29: 25 ug via INTRAVENOUS
  Administered 2012-07-29: 50 ug via INTRAVENOUS
  Administered 2012-07-29 (×3): 25 ug via INTRAVENOUS

## 2012-07-29 MED ORDER — PROPOFOL 10 MG/ML IV EMUL
INTRAVENOUS | Status: AC
  Filled 2012-07-29: qty 20

## 2012-07-29 MED ORDER — ONDANSETRON HCL 4 MG/2ML IJ SOLN: 4.0000 mg | Freq: Once | INTRAMUSCULAR | Status: DC

## 2012-07-29 MED ORDER — ACETAMINOPHEN 10 MG/ML IV SOLN
INTRAVENOUS | Status: AC
  Filled 2012-07-29: qty 100

## 2012-07-29 MED ORDER — HYDROCODONE-ACETAMINOPHEN 5-325 MG PO TABS: 1.0000 | ORAL_TABLET | ORAL | Status: AC | PRN

## 2012-07-29 MED ORDER — VANCOMYCIN HCL IN DEXTROSE 1-5 GM/200ML-% IV SOLN
INTRAVENOUS | Status: AC
  Filled 2012-07-29: qty 200

## 2012-07-29 MED ORDER — ONDANSETRON HCL 4 MG/2ML IJ SOLN
4.0000 mg | Freq: Once | INTRAMUSCULAR | Status: AC
  Administered 2012-07-29: 4 mg via INTRAVENOUS

## 2012-07-29 MED ORDER — EPHEDRINE SULFATE 50 MG/ML IJ SOLN
INTRAMUSCULAR | Status: AC
  Filled 2012-07-29: qty 1

## 2012-07-29 MED ORDER — ENOXAPARIN SODIUM 30 MG/0.3ML ~~LOC~~ SOLN: 30.0000 mg | Freq: Once | SUBCUTANEOUS | Status: DC

## 2012-07-29 MED ORDER — ONDANSETRON HCL 4 MG/2ML IJ SOLN
INTRAMUSCULAR | Status: AC
  Filled 2012-07-29: qty 2

## 2012-07-29 MED ORDER — EPHEDRINE SULFATE 50 MG/ML IJ SOLN
INTRAMUSCULAR | Status: DC | PRN
  Administered 2012-07-29: 10 mg via INTRAVENOUS

## 2012-07-29 MED ORDER — LIDOCAINE HCL (PF) 1 % IJ SOLN
INTRAMUSCULAR | Status: AC
  Filled 2012-07-29: qty 2

## 2012-07-29 MED ORDER — BUPIVACAINE-EPINEPHRINE PF 0.5-1:200000 % IJ SOLN
INTRAMUSCULAR | Status: AC
  Filled 2012-07-29: qty 20

## 2012-07-29 MED ORDER — FENTANYL CITRATE 0.05 MG/ML IJ SOLN: 25.0000 ug | INTRAMUSCULAR | Status: DC | PRN

## 2012-07-29 MED ORDER — CELECOXIB 100 MG PO CAPS
400.0000 mg | ORAL_CAPSULE | Freq: Once | ORAL | Status: AC
  Administered 2012-07-29: 400 mg via ORAL

## 2012-07-29 MED ORDER — PROMETHAZINE HCL 12.5 MG PO TABS: 12.5000 mg | ORAL_TABLET | Freq: Four times a day (QID) | ORAL | Status: DC | PRN

## 2012-07-29 MED ORDER — ACETAMINOPHEN 10 MG/ML IV SOLN
1000.0000 mg | Freq: Once | INTRAVENOUS | Status: AC
  Administered 2012-07-29: 1000 mg via INTRAVENOUS

## 2012-07-29 MED ORDER — TRAMADOL HCL 50 MG PO TABS
50.0000 mg | ORAL_TABLET | Freq: Once | ORAL | Status: AC
  Administered 2012-07-29: 50 mg via ORAL

## 2012-07-29 SURGICAL SUPPLY — 52 items
0.9% NORMAL SALINE 3000ML WITH 1ML EPINEPHRINE  1:1000 IMPLANT
ARTHROWAND PARAGON T2 (SURGICAL WAND)
BAG HAMPER (MISCELLANEOUS) ×3 IMPLANT
BANDAGE ELASTIC 6 VELCRO NS (GAUZE/BANDAGES/DRESSINGS) ×3 IMPLANT
BANDAGE ESMARK 4X12 BL STRL LF (DISPOSABLE) ×2 IMPLANT
BLADE AGGRESSIVE PLUS 4.0 (BLADE) ×3 IMPLANT
BLADE SURG SZ11 CARB STEEL (BLADE) ×3 IMPLANT
BNDG ESMARK 4X12 BLUE STRL LF (DISPOSABLE) ×3
CHLORAPREP W/TINT 26ML (MISCELLANEOUS) ×3 IMPLANT
CLOTH BEACON ORANGE TIMEOUT ST (SAFETY) ×3 IMPLANT
COOLER CRYO IC GRAV AND TUBE (ORTHOPEDIC SUPPLIES) ×3 IMPLANT
CUFF CRYO KNEE LG 20X31 COOLER (ORTHOPEDIC SUPPLIES) IMPLANT
CUFF CRYO KNEE18X23 MED (MISCELLANEOUS) ×3 IMPLANT
CUFF TOURNIQUET SINGLE 34IN LL (TOURNIQUET CUFF) ×3 IMPLANT
CUFF TOURNIQUET SINGLE 44IN (TOURNIQUET CUFF) IMPLANT
CUTTER ANGLED DBL BITE 4.5 (BURR) IMPLANT
DECANTER SPIKE VIAL GLASS SM (MISCELLANEOUS) ×6 IMPLANT
FLOOR PAD 36X40 (MISCELLANEOUS)
GAUZE SPONGE 4X4 16PLY XRAY LF (GAUZE/BANDAGES/DRESSINGS) ×3 IMPLANT
GAUZE XEROFORM 5X9 LF (GAUZE/BANDAGES/DRESSINGS) ×3 IMPLANT
GLOVE SKINSENSE NS SZ8.0 LF (GLOVE) ×1
GLOVE SKINSENSE STRL SZ8.0 LF (GLOVE) ×2 IMPLANT
GLOVE SS N UNI LF 8.5 STRL (GLOVE) ×3 IMPLANT
GOWN STRL REIN XL XLG (GOWN DISPOSABLE) ×6 IMPLANT
HLDR LEG FOAM (MISCELLANEOUS) ×2 IMPLANT
IV NS IRRIG 3000ML ARTHROMATIC (IV SOLUTION) ×6 IMPLANT
KIT BLADEGUARD II DBL (SET/KITS/TRAYS/PACK) ×3 IMPLANT
KIT ROOM TURNOVER AP CYSTO (KITS) ×3 IMPLANT
LEG HOLDER FOAM (MISCELLANEOUS) ×1
MANIFOLD NEPTUNE II (INSTRUMENTS) ×3 IMPLANT
MARKER SKIN DUAL TIP RULER LAB (MISCELLANEOUS) ×3 IMPLANT
NEEDLE HYPO 18GX1.5 BLUNT FILL (NEEDLE) ×3 IMPLANT
NEEDLE HYPO 21X1.5 SAFETY (NEEDLE) ×3 IMPLANT
NEEDLE SPNL 18GX3.5 QUINCKE PK (NEEDLE) ×3 IMPLANT
NS IRRIG 1000ML POUR BTL (IV SOLUTION) ×3 IMPLANT
PACK ARTHRO LIMB DRAPE STRL (MISCELLANEOUS) ×3 IMPLANT
PAD ABD 5X9 TENDERSORB (GAUZE/BANDAGES/DRESSINGS) ×6 IMPLANT
PAD ARMBOARD 7.5X6 YLW CONV (MISCELLANEOUS) ×3 IMPLANT
PAD FLOOR 36X40 (MISCELLANEOUS) IMPLANT
PADDING CAST COTTON 6X4 STRL (CAST SUPPLIES) ×3 IMPLANT
SET ARTHROSCOPY INST (INSTRUMENTS) ×3 IMPLANT
SET ARTHROSCOPY PUMP TUBE (IRRIGATION / IRRIGATOR) ×3 IMPLANT
SET BASIN LINEN APH (SET/KITS/TRAYS/PACK) ×3 IMPLANT
SPONGE GAUZE 4X4 12PLY (GAUZE/BANDAGES/DRESSINGS) ×3 IMPLANT
STRIP CLOSURE SKIN 1/2X4 (GAUZE/BANDAGES/DRESSINGS) IMPLANT
SUT ETHILON 3 0 FSL (SUTURE) ×3 IMPLANT
SYR 30ML LL (SYRINGE) ×3 IMPLANT
SYRINGE 10CC LL (SYRINGE) ×3 IMPLANT
WAND 50 DEG COVAC W/CORD (SURGICAL WAND) IMPLANT
WAND 90 DEG TURBOVAC W/CORD (SURGICAL WAND) ×3 IMPLANT
WAND ARTHRO PARAGON T2 (SURGICAL WAND) IMPLANT
YANKAUER SUCT BULB TIP 10FT TU (MISCELLANEOUS) ×12 IMPLANT

## 2012-07-29 NOTE — Anesthesia Postprocedure Evaluation (Signed)
  Anesthesia Post-op Note  Patient: [name]  Procedure(s) Performed: Procedure(s) (LRB): KNEE ARTHROSCOPY WITH LATERAL MENISECTOMY (Left)  Patient Location: PACU  Anesthesia Type: MAC  Level of Consciousness: awake, alert  and oriented  Airway and Oxygen Therapy: Patient Spontanous Breathing and Patient connected to nasal cannula oxygen  Post-op Pain: none  Post-op Assessment: Post-op Vital signs reviewed, Patient's Cardiovascular Status Stable, Respiratory Function Stable, Patent Airway and No signs of Nausea or vomiting  Post-op Vital Signs: Reviewed and stable  Complications: No apparent anesthesia complications

## 2012-07-29 NOTE — Anesthesia Procedure Notes (Signed)
Procedure Name: LMA Insertion Date/Time: 07/29/2012 11:16 AM Performed by: Glynn Octave E Pre-anesthesia Checklist: Patient identified, Patient being monitored, Emergency Drugs available, Timeout performed and Suction available Patient Re-evaluated:Patient Re-evaluated prior to inductionOxygen Delivery Method: Circle System Utilized Preoxygenation: Pre-oxygenation with 100% oxygen Intubation Type: IV induction Ventilation: Mask ventilation without difficulty LMA: LMA inserted LMA Size: 4.0 Number of attempts: 1 Placement Confirmation: positive ETCO2 and breath sounds checked- equal and bilateral

## 2012-07-29 NOTE — Brief Op Note (Addendum)
07/29/2012  12:05 PM  PATIENT:  [name]  66 y.o. female  PRE-OPERATIVE DIAGNOSIS:  left knee medial meniscus tear, osteoarthritis, stress fracture, Baker's cyst  POST-OPERATIVE DIAGNOSIS:  left knee lateral meniscus tear, osteoarthritis, stress fracture, Baker's cyst  PROCEDURE:  Procedure(s) (LRB): KNEE ARTHROSCOPY WITH LATERAL MENISECTOMY (Left)  Findings: The medial cartilage of the femoral condyle was completely denuded. There was also cartilage loss under the anterior horn of the medial meniscus. There were several bone spurs in the joint with a significant synovitis, the lateral meniscus was torn along the free edges from the posterior horn to the anterior horn. There was grade 2 chondromalacia of the patella on the lateral and median ridge. There was grade 2 chondromalacia of the lateral tibial plateau  SURGEON:  Surgeon(s) and Role:    * Vickki Hearing, MD - Primary  PHYSICIAN ASSISTANT:   ASSISTANTS: none   ANESTHESIA:   general  EBL:  Total I/O In: 500 [I.V.:500] Out: 0   BLOOD ADMINISTERED:none  DRAINS: none   LOCAL MEDICATIONS USED:  MARCAINE   , Amount: 60 ml and OTHER epi  SPECIMEN:  No Specimen  DISPOSITION OF SPECIMEN:  N/A  COUNTS:  YES  TOURNIQUET:  * Missing tourniquet times found for documented tourniquets in log:  [id number] *  DICTATION: .Dragon Dictation  PLAN OF CARE: Discharge to home after PACU  PATIENT DISPOSITION:  PACU - hemodynamically stable.   Delay start of Pharmacological VTE agent (>24hrs) due to surgical blood loss or risk of bleeding: no

## 2012-07-29 NOTE — Anesthesia Preprocedure Evaluation (Addendum)
Anesthesia Evaluation  Patient identified by MRN, date of birth, ID band Patient awake    Reviewed: Allergy & Precautions, H&P , NPO status , Patient's Chart, lab work & pertinent test results  Airway Mallampati: I TM Distance: >3 FB Neck ROM: Full    Dental No notable dental hx.    Pulmonary sleep apnea ,    Pulmonary exam normal       Cardiovascular hypertension, Pt. on medications Rhythm:Regular Rate:Normal     Neuro/Psych    GI/Hepatic GERD-  Medicated and Controlled,  Endo/Other  Hypothyroidism   Renal/GU      Musculoskeletal negative musculoskeletal ROS (+)   Abdominal Normal abdominal exam  (+)   Peds  Hematology negative hematology ROS (+)   Anesthesia Other Findings   Reproductive/Obstetrics                          Anesthesia Physical Anesthesia Plan  ASA: II  Anesthesia Plan: General   Post-op Pain Management:    Induction: Intravenous  Airway Management Planned: LMA  Additional Equipment:   Intra-op Plan:   Post-operative Plan: Extubation in OR  Informed Consent: I have reviewed the patients History and Physical, chart, labs and discussed the procedure including the risks, benefits and alternatives for the proposed anesthesia with the patient or authorized representative who has indicated his/her understanding and acceptance.     Plan Discussed with: CRNA  Anesthesia Plan Comments:         Anesthesia Quick Evaluation

## 2012-07-29 NOTE — Interval H&P Note (Signed)
History and Physical Interval Note:  07/29/2012 10:37 AM  [name]  has presented today for surgery, with the diagnosis of left knee meniscus tear  The various methods of treatment have been discussed with the patient and family. After consideration of risks, benefits and other options for treatment, the patient has consented to  Procedure(s) (LRB): KNEE ARTHROSCOPY WITH MEDIAL MENISECTOMY (Left) as a surgical intervention .  The patient's history has been reviewed, patient examined, no change in status, stable for surgery.  I have reviewed the patient's chart and labs.  Questions were answered to the patient's satisfaction.     [name]

## 2012-07-29 NOTE — Op Note (Addendum)
07/29/2012  12:05 PM  PATIENT:  [name]  66 y.o. female  PRE-OPERATIVE DIAGNOSIS:  left knee medial meniscus tear, osteoarthritis, stress fracture, Baker's cyst  POST-OPERATIVE DIAGNOSIS:  left knee lateral meniscus tear, osteoarthritis, stress fracture, Baker's cyst  PROCEDURE:  Procedure(s) (LRB): KNEE ARTHROSCOPY WITH LATERAL MENISECTOMY (Left)  Findings: The medial cartilage of the femoral condyle was completely denuded. There was also cartilage loss under the anterior horn of the medial meniscus. There were several bone spurs in the joint with a significant synovitis, the lateral meniscus was torn along the free edges from the posterior horn to the anterior horn. There was grade 2 chondromalacia of the patella on the lateral and median ridge. There was grade 2 chondromalacia of the lateral tibial plateau  SURGEON:  Surgeon(s) and Role:    * Vickki Hearing, MD - Primary  PHYSICIAN ASSISTANT:   ASSISTANTS: none   ANESTHESIA:   general  EBL:  Total I/O In: 500 [I.V.:500] Out: 0   BLOOD ADMINISTERED:none  DRAINS: none   LOCAL MEDICATIONS USED:  MARCAINE   , Amount: 60 ml and OTHER epi  SPECIMEN:  No Specimen  DISPOSITION OF SPECIMEN:  N/A  COUNTS:  YES  TOURNIQUET:  * Missing tourniquet times found for documented tourniquets in log:  [id number] *  DICTATION: .Dragon Dictation  PLAN OF CARE: Discharge to home after PACU  PATIENT DISPOSITION:  PACU - hemodynamically stable.   Delay start of Pharmacological VTE agent (>24hrs) due to surgical blood loss or risk of bleeding: no  Details of procedure   The operative site was marked in the preop area and the site was confirmed I interviewed the patient and chart review. The preoperative antibiotic was given and she was taken to the operating room for general anesthesia. Anesthesia was smooth the patient was placed in the supine position with the left leg in an arthroscopic leg holder and the right leg in a padded well  leg holder. After sterile prep and drape the timeout procedure was executed.  A lateral portal was established and the scope was placed into the joint and the medial compartment. A diagnostic arthroscopy start immediately progressed across the knee to include the notch lateral compartment patellofemoral joint and medial and lateral gutters. Operative findings included grade 4 arthritis of the medial femoral condyle and anteromedial tibial plateau with several osteophytes and synovitis. The anterior cruciate ligament and PCL were intact with several spurs in the notch as well as synovial proliferation which was hemorrhagic.  The lateral compartment showed grade 2 chondromalacia on the tibial surface and free edge tearing of the lateral meniscus. The patellofemoral joint showed grade 2 chondromalacia of the median ridge and lateral facet of the patella. There is a synovial plica medially.  The medial portal was made and a probe was placed in the joint and the diagnostic portion was reviewed repeated probing the intra-articular structures. The medial meniscus was intact.  He shaver was used to perform a partial lateral meniscectomy. Stable rim was confirmed.  Joint debridement was then performed with a shaver and ArthroCare wand. Obvious bleeding was coagulated. The knee was irrigated. The portals were closed with 3-0 nylon sutures one on the medial side 3 on the lateral side. The joint was injected with 60 cc of Marcaine with epinephrine. Sterile dressing and Cryo/Cuff was applied. A six-inch Ace wrap applied. Cryo/Cuff was activated.

## 2012-07-29 NOTE — Transfer of Care (Signed)
Immediate Anesthesia Transfer of Care Note  Patient: [name]  Procedure(s) Performed: Procedure(s) (LRB): KNEE ARTHROSCOPY WITH LATERAL MENISECTOMY (Left)  Patient Location: PACU  Anesthesia Type: General  Level of Consciousness: awake, alert  and oriented  Airway & Oxygen Therapy: Patient Spontanous Breathing and Patient connected to nasal cannula oxygen  Post-op Assessment: Report given to PACU RN  Post vital signs: Reviewed and stable  Complications: No apparent anesthesia complications

## 2012-08-01 ENCOUNTER — Encounter: Payer: Self-pay | Admitting: Orthopedic Surgery

## 2012-08-01 ENCOUNTER — Ambulatory Visit (INDEPENDENT_AMBULATORY_CARE_PROVIDER_SITE_OTHER): Payer: BC Managed Care – PPO | Admitting: Orthopedic Surgery

## 2012-08-01 VITALS — BP 140/80 | Ht 60.0 in | Wt 158.0 lb

## 2012-08-01 DIAGNOSIS — M23302 Other meniscus derangements, unspecified lateral meniscus, unspecified knee: Secondary | ICD-10-CM

## 2012-08-01 DIAGNOSIS — Z9889 Other specified postprocedural states: Secondary | ICD-10-CM

## 2012-08-01 NOTE — Progress Notes (Signed)
Patient ID: [name], female   DOB: [birth year], 66 y.o.   MRN: [id number] Chief Complaint  Patient presents with  . Follow-up    SALK, DOS 07/29/12    BP 140/80  Ht 5' (1.524 m)  Wt 158 lb (71.668 kg)  BMI 30.86 kg/m2  Postop visit #1  Postop day 3  Doing well  Portals are clean sutures removed  Knee flexion 90  Weightbearing as tolerated with a walker  Start physical therapy return 3 weeks

## 2012-08-01 NOTE — Patient Instructions (Signed)
START PHYSICAL THERAPY CALL [facility] FOR APPT

## 2012-08-03 MED FILL — Epinephrine HCl Inj 1 MG/ML: INTRAMUSCULAR | Qty: 1 | Status: AC

## 2012-08-04 ENCOUNTER — Ambulatory Visit (HOSPITAL_COMMUNITY)
Admission: RE | Admit: 2012-08-04 | Discharge: 2012-08-04 | Disposition: A | Discharge status: Home / Self Care | Payer: BC Managed Care – PPO | Source: Ambulatory Visit | Attending: Orthopedic Surgery | Admitting: Orthopedic Surgery

## 2012-08-04 DIAGNOSIS — M25569 Pain in unspecified knee: Secondary | ICD-10-CM | POA: Insufficient documentation

## 2012-08-04 DIAGNOSIS — IMO0001 Reserved for inherently not codable concepts without codable children: Secondary | ICD-10-CM | POA: Insufficient documentation

## 2012-08-04 DIAGNOSIS — R262 Difficulty in walking, not elsewhere classified: Secondary | ICD-10-CM | POA: Insufficient documentation

## 2012-08-04 DIAGNOSIS — M25669 Stiffness of unspecified knee, not elsewhere classified: Secondary | ICD-10-CM | POA: Insufficient documentation

## 2012-08-04 NOTE — Evaluation (Signed)
Physical Therapy Evaluation  Patient Details  Name: [name] MRN: [id number] Date of Birth: [birth year]  Today's Date: 08/04/2012 Time: 4098-1191 PT Time Calculation (min): 32 min Charges: 1 eval Visit#: 1  of 6   Re-eval: 08/25/12 Assessment Diagnosis: L knee scope Surgical Date: 07/29/12 Next MD Visit: Dr. Romeo Apple - 08/24/12   Past Medical History:  Past Medical History  Diagnosis Date  . HTN (hypertension)   . Thyroid disease   . Acid reflux   . Hypothyroidism   . Sleep apnea     Stop [name] score of 4   Past Surgical History:  Past Surgical History  Procedure Date  . Cholecystectomy   . Bladder surgery   . Partial hysterectomy   . Umbilical hernia repair   . Tubal ligation     Subjective Symptoms/Limitations Symptoms: PMH:  Pertinent History: Pt is referred to PT secondary to L knee scope.  She has been seeking advice from Dr. Starling Manns since June after revealing a tibia stress fracture  She has undergone conservative treatment of bracing and pain medications which did not help her pain.  Her c/co is pain, waddling when she walks and decreased ROM.   How long can you walk comfortably?: She is walking comfortably in her house and her mailbox.  Patient Stated Goals: "I want to walk without waddling" Pain Assessment Currently in Pain?: Yes Pain Score:   3 Pain Location: Knee Pain Orientation: Left Pain Type: Acute pain;Surgical pain  Prior Functioning  Home Living Lives With: Spouse Home Access: Stairs to enter Home Layout: Two level (13 stairs into basement) Prior Function Able to Take Stairs?: Reciprically Vocation Requirements: She works at [employer] as a Associate Professor.  She is on her feet for 8 hours a day, 5 days a week. Comments: She enjoys watching TV  Sensation/Coordination/Flexibility/Functional Tests Functional Tests Functional Tests: LEFS: 25/80  Assessment LLE AROM (degrees) Left Knee Extension: 2  Left Knee Flexion: 101  LLE PROM  (degrees) Left Knee Extension: 0 Left Knee Flexion: 112 LLE Strength Left Hip Flexion: 3/5 Left Hip Extension: 4/5 Left Hip ABduction: 4/5 Left Knee Flexion: 4/5 Left Knee Extension: 3+/5 Palpation Palpation: increased pain and tenderness to popliteal fossa and medial joint line.   Mobility/Balance  Ambulation/Gait Ambulation/Gait: Yes Ambulation/Gait Assistance: 7: Independent Gait Pattern: Decreased hip/knee flexion - left;Decreased weight shift to left;Left foot flat Stairs: No (pt reports step to pattern) Static Standing Balance Single Leg Stance - Right Leg: 10  Single Leg Stance - Left Leg: 10  Tandem Stance - Right Leg: 5  Tandem Stance - Left Leg: 5  Rhomberg - Eyes Opened: 10  Rhomberg - Eyes Closed: 10    Exercise/Treatments Supine Short Arc Quad Sets: Right;5 reps;Other (comment) (5 sec holds) Heel Slides: Left;10 reps (HEP) Straight Leg Raises: Right;10 reps (HEP) Other Supine Knee Exercises: Heel and Toe walking 1 RT (HEP) Sidelying Hip ABduction: Left;10 reps Prone  Hamstring Curl: 10 reps (HEP) Hip Extension: Left;10 reps (HEP)      Physical Therapy Assessment and Plan PT Assessment and Plan Clinical Impression Statement: Pt is a 66 year old female s/p L knee scope.  She was able to complete all exercises without increased pain.  She was able to complete all exercise with minimal instruction for form.  I anticipate she will likely require 3-4 visits of PT, hwoever will schedule 2x/week for 3 weeks per MD orders.  Pt will benefit from skilled therapeutic intervention in order to improve on the  following deficits: Abnormal gait;Pain;Decreased strength;Decreased range of motion Rehab Potential: Good PT Frequency: Min 2X/week PT Duration: Other (comment) (3 weeks) PT Treatment/Interventions: Gait training;Stair training;Therapeutic activities;Therapeutic exercise;Balance training;Neuromuscular re-education;Patient/family education;Manual  techniques;Modalities PT Plan: Emphasise HEP.  Do not use Bike.  encourage LE strengthening: squats, heel/toe raises, standing knee flexion, TKE, SAQ, stair training.  Keep pain LOW. emphasize a functional HEP    Goals Home Exercise Program Pt will Perform Home Exercise Program: Independently PT Goal: Perform Home Exercise Program - Progress: Goal set today PT Short Term Goals Time to Complete Short Term Goals: 3 weeks PT Short Term Goal 1: Pt will improve her L knee ROM: 0-125 degrees in order to ambulate with appropriate gait mechanics. 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   Problem List Patient Active Problem List  Diagnosis  . Stress fracture of left tibia  . Osteoarthritis of left knee  . Medial meniscus, posterior horn derangement  . Lateral meniscus derangement  . Knee pain  . Knee stiffness    PT Plan of Care PT Home Exercise Plan: see scanned repot PT Patient Instructions: importance of HEP Consulted and Agree with Plan of Care: Patient;Family member/caregiver Family Member Consulted: Husband [name])  Trelon Plush, PT 08/04/2012, 12:26 PM  Physician Documentation Your signature is required to indicate approval of the treatment plan as stated above.  Please sign and either send electronically or make a copy of this report for your files and return this physician signed original.   Please mark one 1.__approve of plan  2. ___approve of plan with the following conditions.   ______________________________                                                          _____________________ Physician Signature                                                                                                             Date

## 2012-08-09 ENCOUNTER — Ambulatory Visit (HOSPITAL_COMMUNITY)
Admission: RE | Admit: 2012-08-09 | Discharge: 2012-08-09 | Disposition: A | Discharge status: Home / Self Care | Payer: BC Managed Care – PPO | Source: Ambulatory Visit | Attending: Orthopedic Surgery | Admitting: Orthopedic Surgery

## 2012-08-09 NOTE — Progress Notes (Signed)
Physical Therapy Treatment Patient Details  Name: [name] MRN: [id number] Date of Birth: 1946-04-24  Today's Date: 08/09/2012 Time: 1349-1430 PT Time Calculation (min): 41 min Visit#: 2  of 6   Re-eval: 08/25/12 Charges:  therex 38'  Subjective: Symptoms/Limitations Symptoms: Pt. reports she has increased pain with increased ambulation.  States she wished her walking tolerance was better.  Pt. currently with 3/10 pain in her L knee. Pain Assessment Currently in Pain?: Yes Pain Score:   3 Pain Location: Knee Pain Orientation: Left   Exercise/Treatments Stretches Active Hamstring Stretch: 3 reps;30 seconds Gastroc Stretch: 3 reps;20 seconds Standing Heel Raises: 10 reps;Limitations Heel Raises Limitations: toeraises 10 reps Terminal Knee Extension: 10 reps;Theraband Theraband Level (Terminal Knee Extension): Level 4 (Blue) Lateral Step Up: 10 reps;Step Height: 2";Hand Hold: 1 Forward Step Up: 10 reps;Step Height: 4";Hand Hold: 1 Functional Squat: 10 reps;Limitations Functional Squat Limitations: limited ROM due to pain Supine Short Arc Quad Sets: 10 reps Heel Slides: 5 reps Bridges: 10 reps Straight Leg Raises: 15 reps Sidelying Hip ABduction: 15 reps Prone  Hamstring Curl: 15 reps Hip Extension: 15 reps      Physical Therapy Assessment and Plan PT Assessment and Plan Clinical Impression Statement: Pain with 4" lateral step ups, having to reduce to 2", also limited ROM with squats to keep pain at minimum.  Added other activities/exercises without difficulty while increasing reps of established exercises.  Pt. reports compliance with her HEP.  PT Plan: Continue POC encouraging LE strengthening and exercise tolerance.  May add treadmill; Do not add Bike. Keep pain Low.     Problem List Patient Active Problem List  Diagnosis  . Stress fracture of left tibia  . Osteoarthritis of left knee  . Medial meniscus, posterior horn derangement  . Lateral meniscus  derangement  . Knee pain  . Knee stiffness      Lurena Nida, PTA/CLT 08/09/2012, 2:36 PM

## 2012-08-11 ENCOUNTER — Ambulatory Visit (HOSPITAL_COMMUNITY)
Admission: RE | Admit: 2012-08-11 | Discharge: 2012-08-11 | Disposition: A | Discharge status: Home / Self Care | Payer: BC Managed Care – PPO | Source: Ambulatory Visit | Attending: Internal Medicine | Admitting: Internal Medicine

## 2012-08-11 NOTE — Progress Notes (Signed)
Physical Therapy Treatment Patient Details  Name: [name] MRN: [id number] Date of Birth: August 25, 1946  Today's Date: 08/11/2012 Time: 6295-2841 PT Time Calculation (min): 48 min Visit#: 3  of 6   Re-eval: 09/23/12 Charges: therex 30', estim unattended, icepack   Subjective: Symptoms/Limitations Symptoms: Pt. reports she has more soreness today and has since last visit.  Currently 4/10 pain. Pain Assessment Currently in Pain?: Yes Pain Score:   4 Pain Location: Knee Pain Orientation: Left    Exercise/Treatments Standing Heel Raises: 10 reps;Limitations Heel Raises Limitations: toeraises 10 reps Terminal Knee Extension: 10 reps;Theraband Theraband Level (Terminal Knee Extension): Level 4 (Blue) Lateral Step Up: 10 reps;Step Height: 2";Hand Hold: 1 Forward Step Up: 10 reps;Step Height: 4";Hand Hold: 1 Functional Squat: 10 reps;Limitations Functional Squat Limitations: limited ROM due to pain Other Standing Knee Exercises: heel and toe walking Supine Short Arc Quad Sets: 10 reps Straight Leg Raises: 15 reps Sidelying Hip ABduction: 15 reps Prone  Hamstring Curl: 15 reps Hip Extension: 15 reps   Modalities Modalities: Electrical Stimulation Manual Therapy Manual Therapy: Edema management Cryotherapy Number Minutes Cryotherapy: 15 Minutes Cryotherapy Location: Knee Type of Cryotherapy: Ice pack Pharmacologist Location: L knee with elevation Electrical Stimulation Action: hi Volt to decrease edema/pain Electrical Stimulation Parameters: Hi Volt, 105 Volts X 15 minutes Electrical Stimulation Goals: Pain;Edema  Physical Therapy Assessment and Plan PT Assessment and Plan Clinical Impression Statement: No new exercises added today due to increased pain/soreness in inferior knee.  Added hi volt estim with ice at end of session to help decrease discomfort.  Pt. reported less soreness at end of session. PT Plan: Continue POC.  Add  treadmill next visit if pain/soreness decreased (no bike).  Continue X 2-3 visits.      Problem List Patient Active Problem List  Diagnosis  . Stress fracture of left tibia  . Osteoarthritis of left knee  . Medial meniscus, posterior horn derangement  . Lateral meniscus derangement  . Knee pain  . Knee stiffness    PT - End of Session Activity Tolerance: Patient tolerated treatment well General Behavior During Session: [facility] for tasks performed Cognition: [facility] for tasks performed   Lurena Nida, PTA/CLT 08/11/2012, 2:07 PM

## 2012-08-16 ENCOUNTER — Ambulatory Visit (HOSPITAL_COMMUNITY)
Admission: RE | Admit: 2012-08-16 | Discharge: 2012-08-16 | Disposition: A | Discharge status: Home / Self Care | Payer: BC Managed Care – PPO | Source: Ambulatory Visit | Attending: Orthopedic Surgery | Admitting: Orthopedic Surgery

## 2012-08-16 DIAGNOSIS — IMO0001 Reserved for inherently not codable concepts without codable children: Secondary | ICD-10-CM | POA: Insufficient documentation

## 2012-08-16 DIAGNOSIS — M25569 Pain in unspecified knee: Secondary | ICD-10-CM | POA: Insufficient documentation

## 2012-08-16 DIAGNOSIS — M25669 Stiffness of unspecified knee, not elsewhere classified: Secondary | ICD-10-CM | POA: Insufficient documentation

## 2012-08-16 DIAGNOSIS — R262 Difficulty in walking, not elsewhere classified: Secondary | ICD-10-CM | POA: Insufficient documentation

## 2012-08-16 NOTE — Progress Notes (Signed)
Physical Therapy Treatment Patient Details  Name: [name] MRN: [id number] Date of Birth: June 04, 1946  Today's Date: 08/16/2012 Time: 1308-6578 PT Time Calculation (min): 39 min Charges: 15' TE, 15' Manual, 8' [place] Visit#: 4  of 6   Re-eval: 09/23/12    Subjective: Symptoms/Limitations Symptoms: "when will I be able to walk without pain." She continues to complain of sensitivity to her knee that she feels when her pant leg touching it. She reports that she had the worst headache after e-stim last time.  Pain Assessment Currently in Pain?: Yes Pain Score:   3 Pain Location: Knee Pain Orientation: Left  Exercise/Treatments Aerobic Stationary Bike: 6' @ 1.0 for ROM Standing Terminal Knee Extension: 10 reps;Theraband (5 sec holds) Theraband Level (Terminal Knee Extension): Level 4 (Blue) Other Standing Knee Exercises: 2 RT: heel walking, Toe walking, tandem gait, retro gait  Modalities Modalities: Ultrasound Manual Therapy Manual Therapy: Joint mobilization Joint Mobilization: Grade II to L knee to improve flexion and to decrease pain to proximal tibia and fibular head.  Patellar mobs in all direction w/scar massage to decrease restrictions and decrease hypersensitivity Ultrasound Ultrasound Location: Anterior L knee joint line Ultrasound Parameters: 3 mhx, 0.8 w/cm2 pulsed x8 min Ultrasound Goals: Pain  Physical Therapy Assessment and Plan PT Assessment and Plan Clinical Impression Statement: Added bike today to encourage AROM and decrease pain. Pt has improved ROM after treatment, however continues to expirence hypersensitivity to knee portals. PT Plan: Continue POC.  Add treadmill next visit if pain/soreness decreased (no bike).  Continue X 2-3 visits.    Goals    Problem List Patient Active Problem List  Diagnosis  . Stress fracture of left tibia  . Osteoarthritis of left knee  . Medial meniscus, posterior horn derangement  . Lateral meniscus derangement  . Knee  pain  . Knee stiffness    PT - End of Session Activity Tolerance: Patient tolerated treatment well General Behavior During Session: [facility] for tasks performed Cognition: [facility] for tasks performed  Alson Mcpheeters, PT 08/16/2012, 3:06 PM

## 2012-08-18 ENCOUNTER — Ambulatory Visit (HOSPITAL_COMMUNITY)
Admission: RE | Admit: 2012-08-18 | Discharge: 2012-08-18 | Disposition: A | Discharge status: Home / Self Care | Payer: BC Managed Care – PPO | Source: Ambulatory Visit | Attending: Internal Medicine | Admitting: Internal Medicine

## 2012-08-18 NOTE — Progress Notes (Signed)
Physical Therapy Treatment Patient Details  Name: SHAKHIA GRAMAJO MRN: [id number] Date of Birth: January 02, 1946  Today's Date: 08/18/2012 Time: 1478-2956 PT Time Calculation (min): 45 min  Visit#: 5  of 6   Re-eval: 09/23/12 Charges: Gait x 8' [place] x 8' Manual x 10' Therex x 15'   Subjective: Symptoms/Limitations Symptoms: Pt states that she was very sore this morning but it has now decreased.  Pain Assessment Currently in Pain?: Yes Pain Score:   2 Pain Location: Knee Pain Orientation: Left   Exercise/Treatments Aerobic Tread Mill: Gait training on TM .50 cycle x 8' to improve gait mechnaics Standing Terminal Knee Extension: 10 reps;Theraband;Other (comment) (10" holds) Theraband Level (Terminal Knee Extension): Level 4 (Blue) Other Standing Knee Exercises: 2 RT: heel walking, Toe walking, retro gait; Tandem on balance beam 1RT  Modalities Modalities: Ultrasound Manual Therapy Joint Mobilization: Grade II to L knee to improve flexion and to decrease pain to proximal tibia and fibular head. Patellar mobs in all direction w/scar massage to decrease restrictions and decrease hypersensitivity  Ultrasound Ultrasound Location: Anterior L knee joint line  Ultrasound Parameters: 3 mhx, 0.8 w/cm2 pulsed x8 min   Physical Therapy Assessment and Plan PT Assessment and Plan Clinical Impression Statement: Pt continues to be hypersensitive around knee incisions. [place] continued secondary to positive results after last session. Pt reports no change in pain at end of session. Pt advised to rub dry washcloth over knee to desensitize nerves. PT Plan: Reassess next session.     Problem List Patient Active Problem List  Diagnosis  . Stress fracture of left tibia  . Osteoarthritis of left knee  . Medial meniscus, posterior horn derangement  . Lateral meniscus derangement  . Knee pain  . Knee stiffness    PT - End of Session Activity Tolerance: Patient tolerated treatment well General Behavior  During Session: [facility] for tasks performed Cognition: [facility] for tasks performed  Seth Bake, PTA 08/18/2012, 4:06 PM

## 2012-08-23 ENCOUNTER — Ambulatory Visit (HOSPITAL_COMMUNITY)
Admission: RE | Admit: 2012-08-23 | Discharge: 2012-08-23 | Disposition: A | Discharge status: Home / Self Care | Payer: BC Managed Care – PPO | Source: Ambulatory Visit | Attending: Internal Medicine | Admitting: Internal Medicine

## 2012-08-23 NOTE — Evaluation (Signed)
Physical Therapy Re-evaluation  Patient Details  Name: [name] MRN: [id number] Date of Birth: 1946/11/26  Today's Date: 08/23/2012 Time: 4098-1191 PT Time Calculation (min): 40 min  Visit#: 6  of 6   Re-eval: 08/23/12 Assessment Diagnosis: L knee scope Surgical Date: 07/29/12 Next MD Visit: Dr. Romeo Apple - 08/24/12   Subjective Symptoms/Limitations Symptoms: Pt. states her pain remains low (under 3/10) unless she attempts walking increased distances; states she has no difficulty standing in one area (like to make a meal).  Continues to have difficulty going down stairs (step-to) but able to ascend reciprocally. Pain Assessment Currently in Pain?: Yes Pain Score:   2 Pain Location: Knee Pain Orientation: Left   Sensation/Coordination/Flexibility/Functional Tests Functional Tests Functional Tests: LEFS: 46/80 (was 25/80)  Assessment LLE AROM (degrees) Left Knee Extension: 0  (was 2 degrees) Left Knee Flexion: 120  (was 101 degrees) LLE PROM (degrees) Left Knee Extension: 0 (was 0) Left Knee Flexion: 128 (was 112 degrees) LLE Strength Left Hip Flexion: 4/5 (was 3/5) Left Hip Extension: 4/5 (was 4/5) Left Hip ABduction: 4/5 (was 4/5) Left Knee Flexion: 5/5 (was 4/5) Left Knee Extension: 4/5 (was 3+/5)  Exercise/Treatments Mobility/Balance  Static Standing Balance Single Leg Stance - Right Leg: 30  (was 10) Single Leg Stance - Left Leg: 30  (was 10)  Treadmill 1. X 8 minutes  Physical Therapy Assessment and Plan PT Assessment and Plan Clinical Impression Statement: Pt. has met 1/3 STG's, partly met remaining.  ROM has returned to WNL and strength has increased; Pt. with most difficulty ambulating community distances and descending steps reciprocally due to pain.  Pt. feels she can continue HEP and progress self independently, however suggested by therapist she continue to work toward meeting all goals.   PT Plan: Per MD recommendations.    Goals Home Exercise  Program Pt will Perform Home Exercise Program: Independently PT Goal: Perform Home Exercise Program - Progress: Met PT Short Term Goals Time to Complete Short Term Goals: 3 weeks PT Short Term Goal 1: Pt will improve her L knee ROM: 0-125 degrees in order to ambulate with appropriate gait mechanics. 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  PT Short Term Goal 3 - Progress: Partly met  Problem List Patient Active Problem List  Diagnosis  . Stress fracture of left tibia  . Osteoarthritis of left knee  . Medial meniscus, posterior horn derangement  . Lateral meniscus derangement  . Knee pain  . Knee stiffness    PT - End of Session Activity Tolerance: Patient tolerated treatment well General Behavior During Session: [facility] for tasks performed Cognition: [facility] for tasks performed   Lurena Nida, PTA/CLT 08/23/2012, 3:29 PM

## 2012-08-24 ENCOUNTER — Encounter: Payer: Self-pay | Admitting: Orthopedic Surgery

## 2012-08-24 ENCOUNTER — Ambulatory Visit (INDEPENDENT_AMBULATORY_CARE_PROVIDER_SITE_OTHER): Payer: BC Managed Care – PPO | Admitting: Orthopedic Surgery

## 2012-08-24 VITALS — BP 122/60 | Ht 60.0 in | Wt 158.0 lb

## 2012-08-24 DIAGNOSIS — M84369A Stress fracture, unspecified tibia and fibula, initial encounter for fracture: Secondary | ICD-10-CM

## 2012-08-24 DIAGNOSIS — M23302 Other meniscus derangements, unspecified lateral meniscus, unspecified knee: Secondary | ICD-10-CM

## 2012-08-24 DIAGNOSIS — M23329 Other meniscus derangements, posterior horn of medial meniscus, unspecified knee: Secondary | ICD-10-CM

## 2012-08-24 DIAGNOSIS — M84362A Stress fracture, left tibia, initial encounter for fracture: Secondary | ICD-10-CM

## 2012-08-24 DIAGNOSIS — M1710 Unilateral primary osteoarthritis, unspecified knee: Secondary | ICD-10-CM

## 2012-08-24 DIAGNOSIS — M1712 Unilateral primary osteoarthritis, left knee: Secondary | ICD-10-CM

## 2012-08-24 NOTE — Patient Instructions (Signed)
Wear brace for long walks or standing   OSTEOBIFLEX OR   COSAMIN

## 2012-08-24 NOTE — Progress Notes (Signed)
Patient ID: [name], female   DOB: 1946-08-30, 66 y.o.   MRN: [id number] Chief Complaint  Patient presents with  . Routine Post Op    recheck Left knee, DOS 07/29/12    Severe arthritis left knee Meniscal pathology left knee  Physical therapy completed. Patient still has pain when she walks for long periods of time or stands for long periods of time on the medial side of her knee.  She has medial tenderness femur and tibia. MRI preop showed stress reaction in this area.  Then McMurray sign is negative. Knee flexion is normal. Muscle tone is excellent.  Recommend brace for an additional month area followup in one month. She can return to work in 2 weeks.

## 2012-08-25 ENCOUNTER — Ambulatory Visit (HOSPITAL_COMMUNITY): Payer: BC Managed Care – PPO | Admitting: Physical Therapy

## 2012-08-30 ENCOUNTER — Ambulatory Visit (HOSPITAL_COMMUNITY): Payer: BC Managed Care – PPO | Admitting: Physical Therapy

## 2012-09-01 ENCOUNTER — Ambulatory Visit (HOSPITAL_COMMUNITY): Payer: BC Managed Care – PPO | Admitting: Physical Therapy

## 2012-09-21 ENCOUNTER — Ambulatory Visit (INDEPENDENT_AMBULATORY_CARE_PROVIDER_SITE_OTHER): Payer: BC Managed Care – PPO | Admitting: Orthopedic Surgery

## 2012-09-21 ENCOUNTER — Encounter: Payer: Self-pay | Admitting: Orthopedic Surgery

## 2012-09-21 VITALS — BP 102/60 | Ht 60.0 in | Wt 158.0 lb

## 2012-09-21 DIAGNOSIS — M23329 Other meniscus derangements, posterior horn of medial meniscus, unspecified knee: Secondary | ICD-10-CM

## 2012-09-21 DIAGNOSIS — Z9889 Other specified postprocedural states: Secondary | ICD-10-CM | POA: Insufficient documentation

## 2012-09-21 DIAGNOSIS — M84362A Stress fracture, left tibia, initial encounter for fracture: Secondary | ICD-10-CM

## 2012-09-21 DIAGNOSIS — M84369A Stress fracture, unspecified tibia and fibula, initial encounter for fracture: Secondary | ICD-10-CM

## 2012-09-21 NOTE — Progress Notes (Signed)
Patient ID: [name], female   DOB: [birth year], 66 y.o.   MRN: [id number] Status post arthroscopy of the left knee with the following findings:  PRE-OPERATIVE DIAGNOSIS: left knee medial meniscus tear, osteoarthritis, stress fracture, Baker's cyst  POST-OPERATIVE DIAGNOSIS: left knee lateral meniscus tear, osteoarthritis, stress fracture, Baker's cyst  PROCEDURE: Procedure(s) (LRB):  KNEE ARTHROSCOPY WITH LATERAL MENISECTOMY (Left)  Findings: The medial cartilage of the femoral condyle was completely denuded. There was also cartilage loss under the anterior horn of the medial meniscus. There were several bone spurs in the joint with a significant synovitis, the lateral meniscus was torn along the free edges from the posterior horn to the anterior horn. There was grade 2 chondromalacia of the patella on the lateral and median ridge. There was grade 2 chondromalacia of the lateral tibial plateau     She still complaining of some medial knee pain but less she gets a lot of support from her brace she has returned to work 6 hours per day her flexibility has returned. At the end of the day her knee feels tight although it doesn't appear to be swollen. She takes one Aleve in the morning and occasionally one at night  She has free and easy range of motion in the left knee without swelling she does have tenderness over the tibia no pain with varus stress or valgus stress lateral compartment nontender  BP 102/60  Ht 5' (1.524 m)  Wt 158 lb (71.668 kg)  BMI 30.86 kg/m2  Osteoarthritis, torn medial meniscus, stress fracture status post knee arthroscopy  Followup 3 months progressively return to normal activity continue brace.

## 2012-09-21 NOTE — Patient Instructions (Addendum)
activities as tolerated  Progressive activity as tolerated

## 2012-09-22 ENCOUNTER — Ambulatory Visit: Payer: BC Managed Care – PPO | Admitting: Orthopedic Surgery

## 2012-12-22 ENCOUNTER — Ambulatory Visit (INDEPENDENT_AMBULATORY_CARE_PROVIDER_SITE_OTHER): Payer: BC Managed Care – PPO | Admitting: Orthopedic Surgery

## 2012-12-22 VITALS — BP 102/62 | Ht 60.0 in | Wt 158.0 lb

## 2012-12-22 DIAGNOSIS — M1710 Unilateral primary osteoarthritis, unspecified knee: Secondary | ICD-10-CM

## 2012-12-22 DIAGNOSIS — Z9889 Other specified postprocedural states: Secondary | ICD-10-CM

## 2012-12-22 DIAGNOSIS — M84362A Stress fracture, left tibia, initial encounter for fracture: Secondary | ICD-10-CM

## 2012-12-22 DIAGNOSIS — M84369A Stress fracture, unspecified tibia and fibula, initial encounter for fracture: Secondary | ICD-10-CM

## 2012-12-22 DIAGNOSIS — M1712 Unilateral primary osteoarthritis, left knee: Secondary | ICD-10-CM

## 2012-12-22 DIAGNOSIS — M23329 Other meniscus derangements, posterior horn of medial meniscus, unspecified knee: Secondary | ICD-10-CM

## 2012-12-22 NOTE — Progress Notes (Signed)
Patient ID: [name], female   DOB: 11-20-1946, 67 y.o.   MRN: [id number] Chief Complaint  Patient presents with  . Follow-up    3 month recheck on left knee. DOS 07-29-12.    Status post arthroscopy of the left knee with the following findings:  PRE-OPERATIVE DIAGNOSIS: left knee medial meniscus tear, osteoarthritis, stress fracture, Baker's cyst   POST-OPERATIVE DIAGNOSIS: left knee lateral meniscus tear, osteoarthritis, stress fracture, Baker's cyst   PROCEDURE: Procedure(s) (LRB):   KNEE ARTHROSCOPY WITH LATERAL MENISECTOMY (Left)   Findings: The medial cartilage of the femoral condyle was completely denuded. There was also cartilage loss under the anterior horn of the medial meniscus. There were several bone spurs in the joint with a significant synovitis, the lateral meniscus was torn along the free edges from the posterior horn to the anterior horn. There was grade 2 chondromalacia of the patella on the lateral and median ridge. There was grade 2 chondromalacia of the lateral tibial plateau    1. Stress fracture of left tibia   2. S/P arthroscopy of left knee   3. Osteoarthritis of left knee   4. Medial meniscus, posterior horn derangement       Overall, she is doing well. She wears her brace everyday. She is able to work. She doesn't have a lot of swelling. She takes one Aleve in the morning occasional. A 2nd Aleve. No catching, locking, or giving way. Exam shows some tenderness on he medial side shows full extension 125 of knee flexion. Her knee is stable.  She will follow up in 6 months for x-ray to check on the arthritis in the medial compartment. Otherwise, she can continue normal activities.

## 2013-02-15 ENCOUNTER — Other Ambulatory Visit (HOSPITAL_COMMUNITY): Payer: Self-pay | Admitting: Internal Medicine

## 2013-02-15 DIAGNOSIS — Z139 Encounter for screening, unspecified: Secondary | ICD-10-CM

## 2013-02-20 ENCOUNTER — Ambulatory Visit (HOSPITAL_COMMUNITY)
Admission: RE | Admit: 2013-02-20 | Discharge: 2013-02-20 | Disposition: A | Discharge status: Home / Self Care | Payer: BC Managed Care – PPO | Source: Ambulatory Visit | Attending: Internal Medicine | Admitting: Internal Medicine

## 2013-02-20 DIAGNOSIS — Z1231 Encounter for screening mammogram for malignant neoplasm of breast: Secondary | ICD-10-CM | POA: Insufficient documentation

## 2013-03-15 ENCOUNTER — Encounter (INDEPENDENT_AMBULATORY_CARE_PROVIDER_SITE_OTHER): Payer: Self-pay | Admitting: *Deleted

## 2013-03-23 ENCOUNTER — Ambulatory Visit (INDEPENDENT_AMBULATORY_CARE_PROVIDER_SITE_OTHER): Payer: BC Managed Care – PPO | Admitting: Internal Medicine

## 2013-03-23 ENCOUNTER — Encounter (INDEPENDENT_AMBULATORY_CARE_PROVIDER_SITE_OTHER): Payer: Self-pay | Admitting: Internal Medicine

## 2013-03-23 VITALS — BP 152/60 | HR 96 | Ht 59.5 in | Wt 158.9 lb

## 2013-03-23 DIAGNOSIS — K219 Gastro-esophageal reflux disease without esophagitis: Secondary | ICD-10-CM | POA: Insufficient documentation

## 2013-03-23 DIAGNOSIS — R1310 Dysphagia, unspecified: Secondary | ICD-10-CM

## 2013-03-23 NOTE — Patient Instructions (Addendum)
EGD/ED. Possible colonoscopy

## 2013-03-23 NOTE — Progress Notes (Signed)
Subjective:     Patient ID: [name], female   DOB: September 22, 1946, 67 y.o.   MRN: [id number]  HPI Here today with c/o dysphagia. For the last couple of months she has had bile reflux. She says its sets her on fire. This has occurred about 4 times. She says she knows what set it off. She says her esophagus feels thick when she swallows. Foods are sometimes slow to go down.  The PPI controls her symptoms for the most part. She takes once a day.  Appetite is good. No weight loss. No abdominal pain. She has a BM about every day. No melena or bright red rectal bleeding.   EGD/ED and Colonoscopy 02/12/2003:   FINAL DIAGNOSES:  PROCEDURE #1: Ulcerative reflux esophagitis with tubular stricture at the  distal esophagus. Stricture also noted at gastroesophageal junction. Small  sliding hiatal hernia. Esophagus dilated by passing 54 [place] Maloney  dilator.  PROCEDURE #2:  1. Incomplete colonoscopy limited to splenic flexure. A few small  diverticula were noted at sigmoid colon.  2. Small external hemorrhoids.  RECOMMENDATIONS: She will continue antireflux measures as before. She  needs to start Prevacid 30 mg p.o. b.i.d. and drop the dose to 30 mg q.a.m.  after two months. Will bring her back for a barium enema in one month from  now. I would like to see her back in the office in three months from now to  make sure her esophageal symptoms are well controlled. PROCEDURE #2 - TOTAL COLONOSCOPY:  Rectal examination performed. No abnormality noted on external or digital  exam. The scope was placed in the rectum and advanced to the region of the  sigmoid colon which was very tortuous. A few small diverticula were noted  in the sigmoid colon. The scope was passed into the splenic flexure but  could not advance the scope into transverse colon. although I had a very  good view of the distal transverse colon. This scope was exchanged with  pediatric scope. The scope once again was passed into the area of  the  splenic flexure but never could pass the scope any further because it kept  looping despite trying in different positions and using abdominal pressure.  The endoscope was withdrawn. In the segments that were examined no polyps  or tumor masses were noted. The rectal mucosa was normal. The scope was  retroflexed to examine the anorectal junction and hemorrhoids were noted  below the dentate line. The endoscope straightened and withdrawn. The  patient tolerated the procedure well.      03/12/2003 Barium Enema  FINDINGS  CLINICAL DATA: COLONIC STRICTURE; REPORTED RECENT INCOMPLETE COLONOSCOPY.  BE WITH AIR HIGH DENSITY  PRELIMINARY SCOUT FILM REVEALS METALLIC CLIPS, RIGHT UPPER QUADRANT SECONDARY TO CHOLECYSTECTOMY.  NO EVIDENCE OF BOWEL DISTENTION. MULTIPLE SMALL SIGMOID COLON DIVERTICULA ARE NOTED. NO STRICTURE  OR MASS. BARIUM REFLUXED INTO THE APPENDIX AND TERMINAL ILEUM.  IMPRESSION  MILD DISTAL LEFT COLON DIVERTICULAR DISEASE; OTHERWISE NEGATIVE BE.          Review of Systems  see hpi Current Outpatient Prescriptions  Medication Sig Dispense Refill  . ALPRAZolam (XANAX) 0.25 MG tablet Take 0.25 mg by mouth at bedtime as needed. For sleep      . aspirin 81 MG tablet Take 81 mg by mouth at bedtime.       . B Complex Vitamins (B-COMPLEX/B-12 SL) Place 1 tablet under the tongue every morning.       Marland Kitchen BIOTIN PO Take  1,000 mg by mouth.      . Calcium-Vitamin D-Vitamin K (VIACTIV PO) Take 1 tablet by mouth every morning.       Marland Kitchen levothyroxine (SYNTHROID, LEVOTHROID) 75 MCG tablet Take 75 mcg by mouth daily before breakfast.       . lisinopril-hydrochlorothiazide (PRINZIDE,ZESTORETIC) 20-12.5 MG per tablet Take 1 tablet by mouth every morning.       . lovastatin (MEVACOR) 20 MG tablet Take 20 mg by mouth at bedtime.      . Magnesium 250 MG TABS Take 1 tablet by mouth every morning.      . naproxen sodium (ANAPROX) 220 MG tablet Take 220 mg by mouth 2 (two) times daily with a meal.  One a day      . omeprazole (PRILOSEC) 20 MG capsule Take 20 mg by mouth every morning.        No current facility-administered medications for this visit.   Past Medical History  Diagnosis Date  . HTN (hypertension)   . Thyroid disease   . Acid reflux   . Hypothyroidism   . Sleep apnea     Stop [name] score of 4   Past Surgical History  Procedure Laterality Date  . Cholecystectomy    . Bladder surgery    . Partial hysterectomy    . Umbilical hernia repair    . Tubal ligation     Allergies  Allergen Reactions  . Ancef (Cefazolin) Anaphylaxis  . Ciprofloxacin Anaphylaxis  . Vancomycin        Objective:   Physical Exam  Filed Vitals:   03/23/13 1354  BP: 152/60  Pulse: 96  Height: 4' 11.5" (1.511 m)  Weight: 158 lb 14.4 oz (72.077 kg)   Alert and oriented. Skin warm and dry. Oral mucosa is moist.   . Sclera anicteric, conjunctivae is pink. Thyroid not enlarged. No cervical lymphadenopathy. Lungs clear. Heart regular rate and rhythm.  Abdomen is soft. Bowel sounds are positive. No hepatomegaly. No abdominal masses felt. No tenderness.  No edema to lower extremities.       Assessment:    Dysphagia, GERD.  Stricture needs to be ruled out. Screening colonoscopy. Last colonoscopy incomplete.  Possible BE. Will need to discuss with Dr. Karilyn Cota.     Plan:     EGD/ED. Will discuss with Dr. 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 Patient also takes ASA on a dialy basis.

## 2013-03-28 ENCOUNTER — Telehealth (INDEPENDENT_AMBULATORY_CARE_PROVIDER_SITE_OTHER): Payer: Self-pay | Admitting: Internal Medicine

## 2013-03-28 ENCOUNTER — Encounter (INDEPENDENT_AMBULATORY_CARE_PROVIDER_SITE_OTHER): Payer: Self-pay | Admitting: *Deleted

## 2013-03-28 ENCOUNTER — Other Ambulatory Visit (INDEPENDENT_AMBULATORY_CARE_PROVIDER_SITE_OTHER): Payer: Self-pay | Admitting: *Deleted

## 2013-03-28 DIAGNOSIS — R1310 Dysphagia, unspecified: Secondary | ICD-10-CM

## 2013-03-28 NOTE — Telephone Encounter (Signed)
I discussed this case with Dr. Karilyn Cota. Will proceed with an EGD/ED. He will do colonoscopy under floro at a later date.    [name], [name] needs to be scheduled for an EGD/ED. I have spoken with her.

## 2013-03-28 NOTE — Telephone Encounter (Signed)
EGD/ED sch'd 04/20/13, patient aware

## 2013-04-03 ENCOUNTER — Encounter (INDEPENDENT_AMBULATORY_CARE_PROVIDER_SITE_OTHER): Payer: Self-pay

## 2013-04-06 ENCOUNTER — Encounter (HOSPITAL_COMMUNITY): Payer: Self-pay | Admitting: Pharmacy Technician

## 2013-04-20 ENCOUNTER — Encounter (HOSPITAL_COMMUNITY)
Admission: RE | Disposition: A | Discharge status: Home / Self Care | Payer: Self-pay | Source: Ambulatory Visit | Attending: Internal Medicine

## 2013-04-20 ENCOUNTER — Ambulatory Visit (HOSPITAL_COMMUNITY)
Admission: RE | Admit: 2013-04-20 | Discharge: 2013-04-20 | Disposition: A | Discharge status: Home / Self Care | Payer: BC Managed Care – PPO | Source: Ambulatory Visit | Attending: Internal Medicine | Admitting: Internal Medicine

## 2013-04-20 ENCOUNTER — Encounter (HOSPITAL_COMMUNITY): Payer: Self-pay | Admitting: *Deleted

## 2013-04-20 DIAGNOSIS — K209 Esophagitis, unspecified without bleeding: Secondary | ICD-10-CM | POA: Insufficient documentation

## 2013-04-20 DIAGNOSIS — K219 Gastro-esophageal reflux disease without esophagitis: Secondary | ICD-10-CM

## 2013-04-20 DIAGNOSIS — R1310 Dysphagia, unspecified: Secondary | ICD-10-CM | POA: Insufficient documentation

## 2013-04-20 DIAGNOSIS — K449 Diaphragmatic hernia without obstruction or gangrene: Secondary | ICD-10-CM | POA: Insufficient documentation

## 2013-04-20 DIAGNOSIS — I10 Essential (primary) hypertension: Secondary | ICD-10-CM | POA: Insufficient documentation

## 2013-04-20 DIAGNOSIS — K2960 Other gastritis without bleeding: Secondary | ICD-10-CM

## 2013-04-20 DIAGNOSIS — K2289 Other specified disease of esophagus: Secondary | ICD-10-CM

## 2013-04-20 DIAGNOSIS — K228 Other specified diseases of esophagus: Secondary | ICD-10-CM

## 2013-04-20 DIAGNOSIS — K222 Esophageal obstruction: Secondary | ICD-10-CM

## 2013-04-20 HISTORY — PX: ESOPHAGOGASTRODUODENOSCOPY (EGD) WITH ESOPHAGEAL DILATION: SHX5812

## 2013-04-20 SURGERY — ESOPHAGOGASTRODUODENOSCOPY (EGD) WITH ESOPHAGEAL DILATION
Anesthesia: Moderate Sedation

## 2013-04-20 MED ORDER — MEPERIDINE HCL 50 MG/ML IJ SOLN
INTRAMUSCULAR | Status: AC
  Filled 2013-04-20: qty 1

## 2013-04-20 MED ORDER — MIDAZOLAM HCL 5 MG/5ML IJ SOLN
INTRAMUSCULAR | Status: AC
  Filled 2013-04-20: qty 10

## 2013-04-20 MED ORDER — STERILE WATER FOR IRRIGATION IR SOLN
Status: DC | PRN
  Administered 2013-04-20: 13:00:00

## 2013-04-20 MED ORDER — SODIUM CHLORIDE 0.9 % IV SOLN
INTRAVENOUS | Status: DC
  Administered 2013-04-20: 1000 mL via INTRAVENOUS

## 2013-04-20 MED ORDER — MIDAZOLAM HCL 5 MG/5ML IJ SOLN
INTRAMUSCULAR | Status: DC | PRN
  Administered 2013-04-20: 2 mg via INTRAVENOUS
  Administered 2013-04-20: 1 mg via INTRAVENOUS
  Administered 2013-04-20: 2 mg via INTRAVENOUS

## 2013-04-20 MED ORDER — MEPERIDINE HCL 25 MG/ML IJ SOLN
INTRAMUSCULAR | Status: DC | PRN
  Administered 2013-04-20 (×2): 25 mg via INTRAVENOUS

## 2013-04-20 MED ORDER — BUTAMBEN-TETRACAINE-BENZOCAINE 2-2-14 % EX AERO
INHALATION_SPRAY | CUTANEOUS | Status: DC | PRN
  Administered 2013-04-20: 2 via TOPICAL

## 2013-04-20 NOTE — Op Note (Addendum)
EGD PROCEDURE REPORT  PATIENT:  [name]  MR#:  [id number] Birthdate:  May 27, 1946, 67 y.o., female Endoscopist:  Dr. Malissa Hippo, MD Referred By:  Dr. Carylon Perches, MD Procedure Date: 04/20/2013  Procedure:   EGD with ED.  Indications:  Patient is 67 years old Caucasian female with history of esophageal stricture secondary to complicated GERD who presents with solid food dysphagia. Her heartburn is controlled with therapy. Last EGD was 10 years ago.           Informed Consent:  The risks, benefits, alternatives & imponderables which include, but are not limited to, bleeding, infection, perforation, drug reaction and potential missed lesion have been reviewed.  The potential for biopsy, lesion removal, esophageal dilation, etc. have also been discussed.  Questions have been answered.  All parties agreeable.  Please see history & physical in medical record for more information.  Medications:  Demerol 50 mg IV Versed 5 mg IV Cetacaine spray topically for oropharyngeal anesthesia  Description of procedure:  The endoscope was introduced through the mouth and advanced to the second portion of the duodenum without difficulty or limitations. The mucosal surfaces were surveyed very carefully during advancement of the scope and upon withdrawal.  Findings:  Esophagus:  The cause of the proximal and middle third was normal. In the distal segment to leave scars noted but no luminal narrowing. Serrated or baby GE junction. GEJ:  36 cm Hiatus:  38 cm Stomach:  Stomach was empty and distended very well with insufflation. Folds in the proximal stomach was normal. Examination mucosa revealed patchy erythema edema aat gastric body and antrum but no erosions or ulcers are noted. Following the channel was patent. In the past, fundus and cardia were normal. Duodenum:  Normal bulbar and post bulbar mucosa.  Therapeutic/Diagnostic Maneuvers Performed:  Esophagus was dilated by passing 54 French balloon dilator  from insertion. As the dilator was withdrawn endoscope was passed again and small linear mucosal disruption noted at cervical esophagus indicative of a web. No mucosal destruction noted in the distal segments. Biopsies were taken from GE junction and endoscope was withdrawn.  Complications:  None  Impression: Serrated or wavy GE junction. Biopsy taken for routine histology. Distal esophageal scarring consistent with healed esophagitis. Small sliding hiatal hernia. Nonerosive gastritis. Esophagus dilated by passing 54 French balloon dilator resulting in linear mucosal disruption at proximal esophagus indicative of a web.  Recommendations:  Continue anti-reflux measures and PPI. H. pylori serology. I will be contacting patient with results of biopsy and blood test.  Sheranda Seabrooks U  04/20/2013  1:15 PM  CC: Dr. Carylon Perches, MD & Dr. Bonnetta Barry ref. provider found

## 2013-04-20 NOTE — H&P (Signed)
[name] is an 67 y.o. female.   Chief Complaint: Patient sent for EGD and ED. HPI: Patient is 67 year old Caucasian female with history of chronic GERD complicated by sufficient stricture who presents with hematemesis and dysphagia to solids. She says it has not been as as it was 10 years ago. Last EGD was in March 2000 ulcerative reflux esophagitis with distal stricture. Patient states her heartburn controlled with therapy. She denies melena anorexia or weight loss.  Past Medical History  Diagnosis Date  . HTN (hypertension)   . Thyroid disease   . Acid reflux   . Hypothyroidism   . Sleep apnea     Stop [name] score of 4    Past Surgical History  Procedure Laterality Date  . Cholecystectomy    . Bladder surgery    . Partial hysterectomy    . Umbilical hernia repair    . Tubal ligation      Family History  Problem Relation Age of Onset  . Heart disease Mother   . Emphysema Father   . Breast cancer Sister   . Heart disease Brother   . Heart disease Brother    Social History:  reports that she has never smoked. She does not have any smokeless tobacco history on file. She reports that she does not drink alcohol or use illicit drugs.  Allergies:  Allergies  Allergen Reactions  . Ancef (Cefazolin) Anaphylaxis  . Ciprofloxacin Anaphylaxis    Medications Prior to Admission  Medication Sig Dispense Refill  . ALPRAZolam (XANAX) 0.25 MG tablet Take 0.25 mg by mouth at bedtime as needed. For sleep      . aspirin 81 MG tablet Take 81 mg by mouth at bedtime.       . B Complex Vitamins (B-COMPLEX/B-12 SL) Place 1 tablet under the tongue every morning.       Marland Kitchen BIOTIN PO Take 1,000 mg by mouth.      . Calcium-Vitamin D-Vitamin K (VIACTIV PO) Take 1 tablet by mouth every morning.       Marland Kitchen levothyroxine (SYNTHROID, LEVOTHROID) 75 MCG tablet Take 75 mcg by mouth daily before breakfast.       . lisinopril-hydrochlorothiazide (PRINZIDE,ZESTORETIC) 20-12.5 MG per tablet Take 1  tablet by mouth every morning.       . lovastatin (MEVACOR) 20 MG tablet Take 20 mg by mouth at bedtime.      . Magnesium 250 MG TABS Take 1 tablet by mouth every morning.      Marland Kitchen omeprazole (PRILOSEC) 20 MG capsule Take 20 mg by mouth every morning.       . naproxen sodium (ANAPROX) 220 MG tablet Take 220 mg by mouth 2 (two) times daily with a meal. One a day        No results found for this or any previous visit (from the past 48 hour(s)). No results found.  ROS  Blood pressure 154/72, pulse 89, temperature 97.9 F (36.6 C), temperature source Oral, resp. rate 16, height 5' (1.524 m), weight 158 lb (71.668 kg), SpO2 94.00%. Physical Exam  Constitutional: She appears well-developed and well-nourished.  HENT:  Mouth/Throat: Oropharynx is clear and moist.  Eyes: Conjunctivae are normal. No scleral icterus.  Neck: No thyromegaly present.  Cardiovascular: Normal rate, regular rhythm and normal heart sounds.   No murmur heard. Respiratory: Effort normal and breath sounds normal.  GI: Soft. She exhibits no distension and no mass. There is no tenderness.  Musculoskeletal: She exhibits no edema.  Lymphadenopathy:    She has no cervical adenopathy.  Neurological: She is alert.  Skin: Skin is warm and dry.     Assessment/Plan Solid food dysphagia. History of esophageal stricture.  EGD with ED.  REHMAN,NAJEEB U 04/20/2013, 12:50 PM

## 2013-04-21 ENCOUNTER — Encounter (HOSPITAL_COMMUNITY): Payer: Self-pay | Admitting: Internal Medicine

## 2013-04-25 ENCOUNTER — Encounter (INDEPENDENT_AMBULATORY_CARE_PROVIDER_SITE_OTHER): Payer: Self-pay | Admitting: *Deleted

## 2013-06-22 ENCOUNTER — Encounter: Payer: Self-pay | Admitting: Orthopedic Surgery

## 2013-06-22 ENCOUNTER — Ambulatory Visit (INDEPENDENT_AMBULATORY_CARE_PROVIDER_SITE_OTHER): Payer: BC Managed Care – PPO | Admitting: Orthopedic Surgery

## 2013-06-22 ENCOUNTER — Ambulatory Visit (INDEPENDENT_AMBULATORY_CARE_PROVIDER_SITE_OTHER): Payer: BC Managed Care – PPO

## 2013-06-22 VITALS — BP 122/66 | Ht 60.0 in | Wt 158.0 lb

## 2013-06-22 DIAGNOSIS — Z9889 Other specified postprocedural states: Secondary | ICD-10-CM

## 2013-06-22 NOTE — Patient Instructions (Signed)
activities as tolerated 

## 2013-06-22 NOTE — Progress Notes (Signed)
Patient ID: [name], female   DOB: Apr 27, 1946, 67 y.o.   MRN: [id number]  Chief Complaint  Patient presents with  . Follow-up    6 month recheck left knee DOS 07/29/12   Status post arthroscopy of the left knee with the following findings: Previous history 1.  Stress fracture of left tibia    2.  S/P arthroscopy of left knee    3.  Osteoarthritis of left knee    4.  Medial meniscus, posterior horn derangement      The patient complains of pain mild. She requires a brace to work. No swelling no mechanical symptoms.  No numbness or tingling  BP 122/66  Ht 5' (1.524 m)  Wt 158 lb (71.668 kg)  BMI 30.86 kg/m2 General appearance is normal, the patient is alert and oriented x3 with normal mood and affect. Ambulates no assisted devices today. Neurovascular exam intact range of motion 125 medial joint line mildly tender   Radiology report Clinical information status post arthroscopy stress fracture medial tibial plateau 3 views left knee  There is medial joint space narrowing on the left hemi-joint but the alignment of the knee remains physiologic in valgus. There is a fair amount of arthritic change with decreased joint space in the patellofemoral joint on the medial side with a centered patella  The medial joint space Mac[place] Large is quite severe.  Impression patellofemoral and medial joint arthrosis  Recommend six-month x-ray activities as tolerated I showed the patient her x-rays and the amount of joint space narrowing she will need to have knee replacement in the future when her symptoms warrant otherwise we'll x-ray again in 6 months

## 2013-10-18 ENCOUNTER — Telehealth (INDEPENDENT_AMBULATORY_CARE_PROVIDER_SITE_OTHER): Payer: Self-pay | Admitting: *Deleted

## 2013-10-18 NOTE — Telephone Encounter (Signed)
Patient left message to sch'd repeat TCS, stated you were not able to complete her last TCS in 2004 and you told her you would need to do something different on her next one, please advise as to what I need to schedule for her -- thanks

## 2013-10-19 NOTE — Telephone Encounter (Signed)
Will attempt colonoscopy with slim scope. Please schedule as last patient of the day.

## 2013-10-26 ENCOUNTER — Telehealth (INDEPENDENT_AMBULATORY_CARE_PROVIDER_SITE_OTHER): Payer: Self-pay | Admitting: *Deleted

## 2013-10-26 ENCOUNTER — Other Ambulatory Visit (INDEPENDENT_AMBULATORY_CARE_PROVIDER_SITE_OTHER): Payer: Self-pay | Admitting: *Deleted

## 2013-10-26 DIAGNOSIS — Z1211 Encounter for screening for malignant neoplasm of colon: Secondary | ICD-10-CM

## 2013-10-26 MED ORDER — PEG-KCL-NACL-NASULF-NA ASC-C 100 G PO SOLR: 1.0000 | Freq: Once | ORAL | Status: DC

## 2013-10-26 NOTE — Telephone Encounter (Signed)
TCS sch'd 12/27/13, patient aware

## 2013-10-26 NOTE — Telephone Encounter (Signed)
Patient needs movi prep 

## 2013-12-04 ENCOUNTER — Telehealth (INDEPENDENT_AMBULATORY_CARE_PROVIDER_SITE_OTHER): Payer: Self-pay | Admitting: *Deleted

## 2013-12-04 NOTE — Telephone Encounter (Signed)
  Procedure: tcs  Reason/Indication:  screening  Has patient had this procedure before?  Yes, 2004 -- EPIC  If so, when, by whom and where?    Is there a family history of colon cancer?  no  Who?  What age when diagnosed?    Is patient diabetic?   no      Does patient have prosthetic heart valve?  no  Do you have a pacemaker?  no  Has patient ever had endocarditis? no  Has patient had joint replacement within last 12 months?  no  Does patient tend to be constipated or take laxatives? no  Is patient on Coumadin, Plavix and/or Aspirin? yes  Medications: asa 81 mg daily, lisinopril/hctz 20/12.5 mg daily, levothyroxine 75 mg daily, omeprazole 20 mg daily, lovastatin 20 mg daily, magnesium 250 mg bid, biotin 2500 mg daily, vit b12 sublingual a couple of times a week  Allergies: cipro, ancef  Medication Adjustment: asa 2 days  Procedure date & time: 12/27/13 at 1030

## 2013-12-06 NOTE — Telephone Encounter (Signed)
agree

## 2013-12-19 ENCOUNTER — Encounter (HOSPITAL_COMMUNITY): Payer: Self-pay | Admitting: Pharmacy Technician

## 2013-12-27 ENCOUNTER — Ambulatory Visit (HOSPITAL_COMMUNITY)
Admission: RE | Admit: 2013-12-27 | Discharge: 2013-12-27 | Disposition: A | Discharge status: Home / Self Care | Payer: BC Managed Care – PPO | Source: Ambulatory Visit | Attending: Internal Medicine | Admitting: Internal Medicine

## 2013-12-27 ENCOUNTER — Encounter (HOSPITAL_COMMUNITY): Payer: Self-pay | Admitting: *Deleted

## 2013-12-27 ENCOUNTER — Encounter (HOSPITAL_COMMUNITY)
Admission: RE | Disposition: A | Discharge status: Home / Self Care | Payer: Self-pay | Source: Ambulatory Visit | Attending: Internal Medicine

## 2013-12-27 DIAGNOSIS — K644 Residual hemorrhoidal skin tags: Secondary | ICD-10-CM | POA: Insufficient documentation

## 2013-12-27 DIAGNOSIS — Z1211 Encounter for screening for malignant neoplasm of colon: Secondary | ICD-10-CM

## 2013-12-27 DIAGNOSIS — I10 Essential (primary) hypertension: Secondary | ICD-10-CM | POA: Insufficient documentation

## 2013-12-27 DIAGNOSIS — K5730 Diverticulosis of large intestine without perforation or abscess without bleeding: Secondary | ICD-10-CM | POA: Insufficient documentation

## 2013-12-27 HISTORY — PX: COLONOSCOPY: SHX5424

## 2013-12-27 SURGERY — COLONOSCOPY
Anesthesia: Moderate Sedation

## 2013-12-27 MED ORDER — LIDOCAINE HCL 2 % EX GEL
CUTANEOUS | Status: AC
  Filled 2013-12-27: qty 30

## 2013-12-27 MED ORDER — MIDAZOLAM HCL 5 MG/5ML IJ SOLN
INTRAMUSCULAR | Status: DC | PRN
  Administered 2013-12-27 (×2): 1 mg via INTRAVENOUS
  Administered 2013-12-27 (×2): 2 mg via INTRAVENOUS

## 2013-12-27 MED ORDER — STERILE WATER FOR IRRIGATION IR SOLN
Status: DC | PRN
  Administered 2013-12-27: 11:00:00

## 2013-12-27 MED ORDER — MEPERIDINE HCL 50 MG/ML IJ SOLN
INTRAMUSCULAR | Status: DC | PRN
  Administered 2013-12-27 (×2): 25 mg via INTRAVENOUS

## 2013-12-27 MED ORDER — SODIUM CHLORIDE 0.9 % IV SOLN
INTRAVENOUS | Status: DC
Start: 2013-12-27 — End: 2013-12-27
  Administered 2013-12-27: 1000 mL via INTRAVENOUS

## 2013-12-27 MED ORDER — MIDAZOLAM HCL 5 MG/5ML IJ SOLN
INTRAMUSCULAR | Status: AC
  Filled 2013-12-27: qty 10

## 2013-12-27 MED ORDER — MEPERIDINE HCL 50 MG/ML IJ SOLN
INTRAMUSCULAR | Status: AC
  Filled 2013-12-27: qty 1

## 2013-12-27 NOTE — Discharge Instructions (Signed)
Colonoscopy, Care After Refer to this sheet in the next few weeks. These instructions provide you with information on caring for yourself after your procedure. Your health care provider may also give you more specific instructions. Your treatment has been planned according to current medical practices, but problems sometimes occur. Call your health care provider if you have any problems or questions after your procedure. WHAT TO EXPECT AFTER THE PROCEDURE  After your procedure, it is typical to have the following:  A small amount of blood in your stool.  Moderate amounts of gas and mild abdominal cramping or bloating. HOME CARE INSTRUCTIONS  Do not drive, operate machinery, or sign important documents for 24 hours.  You may shower and resume your regular physical activities, but move at a slower pace for the first 24 hours.  Take frequent rest periods for the first 24 hours.  Drink enough fluids to keep your urine clear or pale yellow.  You may resume your normal diet as instructed by your health care provider.  Avoid drinking alcohol for 24 hours or as instructed by your health care provider.  Only take over-the-counter or prescription medicines as directed by your health care provider.  If a tissue sample (biopsy) was taken during your procedure:  Do not take aspirin or blood thinners for 7 days, or as instructed by your health care provider.  Do not drink alcohol for 7 days, or as instructed by your health care provider.  Eat soft foods for the first 24 hours. SEEK MEDICAL CARE IF: You have persistent spotting of blood in your stool 2 3 days after the procedure. SEEK IMMEDIATE MEDICAL CARE IF:  You have more than a small spotting of blood in your stool.  You pass large blood clots in your stool.  Your abdomen is swollen (distended).  You have nausea or vomiting.  You have a fever.  You have increasing abdominal pain that is not relieved with  medicine.   Diverticulosis Diverticulosis is a common condition that develops when small pouches (diverticula) form in the wall of the colon. The risk of diverticulosis increases with age. It happens more often in people who eat a low-fiber diet. Most individuals with diverticulosis have no symptoms. Those individuals with symptoms usually experience abdominal pain, constipation, or loose stools (diarrhea). HOME CARE INSTRUCTIONS   Increase the amount of fiber in your diet as directed by your caregiver or dietician. This may reduce symptoms of diverticulosis.  Your caregiver may recommend taking a dietary fiber supplement.  Drink at least 6 to 8 glasses of water each day to prevent constipation.  Try not to strain when you have a bowel movement.  Your caregiver may recommend avoiding nuts and seeds to prevent complications, although this is still an uncertain benefit.  Only take over-the-counter or prescription medicines for pain, discomfort, or fever as directed by your caregiver. FOODS WITH HIGH FIBER CONTENT INCLUDE:  Fruits. Apple, peach, pear, tangerine, raisins, prunes.  Vegetables. Brussels sprouts, asparagus, broccoli, cabbage, carrot, cauliflower, romaine lettuce, spinach, summer squash, tomato, winter squash, zucchini.  Starchy Vegetables. Baked beans, kidney beans, lima beans, split peas, lentils, potatoes (with skin).  Grains. Whole wheat bread, brown rice, bran flake cereal, plain oatmeal, white rice, shredded wheat, bran muffins. SEEK IMMEDIATE MEDICAL CARE IF:   You develop increasing pain or severe bloating.  You have an oral temperature above 102 F (38.9 C), not controlled by medicine.  You develop vomiting or bowel movements that are bloody or black.  Resume usual medications and high fiber diet. No driving for 24 hours. Next screening exam in 10 years.

## 2013-12-27 NOTE — Op Note (Signed)
COLONOSCOPY PROCEDURE REPORT  PATIENT:  [name]  MR#:  [id number] Birthdate:  [birth year], 68 y.o., female Endoscopist:  Dr. Rogene Houston, MD Referred By:  Dr. [name], MD Procedure Date: 12/27/2013  Procedure:   Colonoscopy  Indications:  Patient is 68 year old Caucasian female is undergoing average risk screening colonoscopy. Patient's last exam was in 2004 and was incomplete and followed by barium enema.  Informed Consent:  The procedure and risks were reviewed with the patient and informed consent was obtained.  Medications:  Demerol 50 mg IV Versed 6 mg IV  Description of procedure:  After a digital rectal exam was performed, that colonoscope was advanced from the anus through the rectum and colon to the area of the cecum, ileocecal valve and appendiceal orifice. The cecum was deeply intubated. These structures were well-seen and photographed for the record. From the level of the cecum and ileocecal valve, the scope was slowly and cautiously withdrawn. The mucosal surfaces were carefully surveyed utilizing scope tip to flexion to facilitate fold flattening as needed. The scope was pulled down into the rectum where a thorough exam including retroflexion was performed.  Findings:  Prep excellent. Scattered diverticula noted at descending and sigmoid colon. Normal rectal mucosa and small hemorrhoids below the dentate line.   Therapeutic/Diagnostic Maneuvers Performed:   None  Complications:  None  Cecal Withdrawal Time:  7 minutes  Impression:  Examination performed to cecum. Mild left-sided diverticulosis. Small external hemorrhoids.  Recommendations:  Standard instructions given. Next screening exam in 10 years.  REHMAN,NAJEEB U  12/27/2013 11:06 AM  CC: Dr. [name], MD & Dr. Rayne Du ref. provider found

## 2013-12-27 NOTE — H&P (Signed)
[name] is an 68 y.o. female.   Chief Complaint: Patient's here for colonoscopy. HPI: Patient is 68 year old Caucasian female seen for screening colonoscopy. She denies abdominal pain change in bowel habits or rectal bleeding. Patient's last colonoscopy was in 2004 and was in complete and followed by barium enema. Family history is negative for CRC. She says she is able to swallow much better since she had her esophagus dilated in May last year.  Past Medical History  Diagnosis Date  . HTN (hypertension)   . Thyroid disease   . Acid reflux   . Hypothyroidism   . Sleep apnea     Stop Fabian November score of 4    Past Surgical History  Procedure Laterality Date  . Cholecystectomy    . Bladder surgery    . Partial hysterectomy    . Umbilical hernia repair    . Tubal ligation    . Esophagogastroduodenoscopy (egd) with esophageal dilation N/A 04/20/2013    Procedure: ESOPHAGOGASTRODUODENOSCOPY (EGD) WITH ESOPHAGEAL DILATION;  Surgeon: Rogene Houston, MD;  [facility];  Service: Endoscopy;  Laterality: N/A;  300-moved to 100 Ann to notify pt    Family History  Problem Relation Age of Onset  . Heart disease Mother   . Emphysema Father   . Breast cancer Sister   . Heart disease Brother   . Heart disease Brother   . Heart disease Sister    Social History:  reports that she has never smoked. She does not have any smokeless tobacco history on file. She reports that she does not drink alcohol or use illicit drugs.  Allergies:  Allergies  Allergen Reactions  . Ancef [Cefazolin] Anaphylaxis  . Ciprofloxacin Anaphylaxis    Medications Prior to Admission  Medication Sig Dispense Refill  . aspirin EC 81 MG tablet Take 81 mg by mouth daily.      . B Complex Vitamins (B-COMPLEX/B-12 SL) Place 1 tablet under the tongue every morning.       Marland Kitchen BIOTIN PO Take 2,500 mg by mouth.       . Calcium-Vitamin D-Vitamin K (VIACTIV PO) Take 1 tablet by mouth every morning.       Marland Kitchen  levothyroxine (SYNTHROID, LEVOTHROID) 75 MCG tablet Take 75 mcg by mouth daily before breakfast.       . lisinopril-hydrochlorothiazide (PRINZIDE,ZESTORETIC) 20-12.5 MG per tablet Take 1 tablet by mouth every morning.       . lovastatin (MEVACOR) 20 MG tablet Take 20 mg by mouth at bedtime.      . Magnesium 250 MG TABS Take 1 tablet by mouth every morning.      . naproxen sodium (ANAPROX) 220 MG tablet Take 220 mg by mouth 2 (two) times daily with a meal. One a day      . omeprazole (PRILOSEC) 20 MG capsule Take 20 mg by mouth every morning.       . peg 3350 powder (MOVIPREP) 100 G SOLR Take 1 kit (200 g total) by mouth once.  1 kit  0  . ALPRAZolam (XANAX) 0.25 MG tablet Take 0.25 mg by mouth at bedtime as needed. For sleep        No results found for this or any previous visit (from the past 48 hour(s)). No results found.  ROS  Blood pressure 123/58, pulse 81, temperature 97.6 F (36.4 C), temperature source Oral, resp. rate 22, SpO2 99.00%. Physical Exam  Constitutional: She appears well-developed and well-nourished.  HENT:  Mouth/Throat: Oropharynx  is clear and moist.  Eyes: Conjunctivae are normal. No scleral icterus.  Neck: No thyromegaly present.  Cardiovascular: Normal rate, regular rhythm and normal heart sounds.   No murmur heard. Respiratory: Effort normal and breath sounds normal.  GI: Soft. She exhibits no mass. There is no tenderness.  Musculoskeletal: She exhibits no edema.  Neurological: She is alert.  Skin: Skin is warm and dry.     Assessment/Plan Average risk screening colonoscopy.  REHMAN,NAJEEB U 12/27/2013, 10:27 AM

## 2013-12-28 ENCOUNTER — Ambulatory Visit (INDEPENDENT_AMBULATORY_CARE_PROVIDER_SITE_OTHER): Payer: BC Managed Care – PPO

## 2013-12-28 ENCOUNTER — Ambulatory Visit (INDEPENDENT_AMBULATORY_CARE_PROVIDER_SITE_OTHER): Payer: BC Managed Care – PPO | Admitting: Orthopedic Surgery

## 2013-12-28 ENCOUNTER — Encounter (HOSPITAL_COMMUNITY): Payer: Self-pay | Admitting: Internal Medicine

## 2013-12-28 VITALS — BP 153/72 | Ht 60.0 in

## 2013-12-28 DIAGNOSIS — M1712 Unilateral primary osteoarthritis, left knee: Secondary | ICD-10-CM

## 2013-12-28 DIAGNOSIS — M1710 Unilateral primary osteoarthritis, unspecified knee: Secondary | ICD-10-CM

## 2013-12-28 DIAGNOSIS — IMO0002 Reserved for concepts with insufficient information to code with codable children: Secondary | ICD-10-CM

## 2013-12-28 NOTE — Progress Notes (Signed)
Patient ID: [name], female   DOB: Dec 14, 1946, 68 y.o.   MRN: [id number] Chief Complaint  Patient presents with  . Follow-up    6 month recheck on left knee with xray.    Its still the same   Mild pain, no functional deficits.  She still working  Review of systems negative  She's maintained her range of motion no instability or strength deficits neurovascular intact gait normal without support mood normal orientation x3 normal general appearance normal vital signs stable BP 153/72  Ht 5' (1.524 m)  X-ray show no change in the medial compartment gonarthrosis which is severe  Recommend six-month followup repeat x-ray in plaster knee worsens

## 2014-03-02 ENCOUNTER — Other Ambulatory Visit (HOSPITAL_COMMUNITY): Payer: Self-pay | Admitting: Internal Medicine

## 2014-03-02 DIAGNOSIS — Z1231 Encounter for screening mammogram for malignant neoplasm of breast: Secondary | ICD-10-CM

## 2014-03-05 ENCOUNTER — Ambulatory Visit (HOSPITAL_COMMUNITY)
Admission: RE | Admit: 2014-03-05 | Discharge: 2014-03-05 | Disposition: A | Discharge status: Home / Self Care | Payer: BC Managed Care – PPO | Source: Ambulatory Visit | Attending: Internal Medicine | Admitting: Internal Medicine

## 2014-03-05 DIAGNOSIS — Z1231 Encounter for screening mammogram for malignant neoplasm of breast: Secondary | ICD-10-CM | POA: Insufficient documentation

## 2014-04-10 ENCOUNTER — Other Ambulatory Visit (HOSPITAL_COMMUNITY): Payer: Self-pay | Admitting: Internal Medicine

## 2014-04-10 DIAGNOSIS — M8580 Other specified disorders of bone density and structure, unspecified site: Secondary | ICD-10-CM

## 2014-04-12 ENCOUNTER — Ambulatory Visit (INDEPENDENT_AMBULATORY_CARE_PROVIDER_SITE_OTHER): Payer: BC Managed Care – PPO | Admitting: Orthopedic Surgery

## 2014-04-12 ENCOUNTER — Ambulatory Visit (INDEPENDENT_AMBULATORY_CARE_PROVIDER_SITE_OTHER): Payer: BC Managed Care – PPO

## 2014-04-12 ENCOUNTER — Encounter: Payer: Self-pay | Admitting: Orthopedic Surgery

## 2014-04-12 VITALS — BP 141/73 | Ht 60.0 in | Wt 158.0 lb

## 2014-04-12 DIAGNOSIS — M1712 Unilateral primary osteoarthritis, left knee: Secondary | ICD-10-CM

## 2014-04-12 DIAGNOSIS — M1710 Unilateral primary osteoarthritis, unspecified knee: Secondary | ICD-10-CM

## 2014-04-12 DIAGNOSIS — IMO0002 Reserved for concepts with insufficient information to code with codable children: Secondary | ICD-10-CM

## 2014-04-12 DIAGNOSIS — M25569 Pain in unspecified knee: Secondary | ICD-10-CM

## 2014-04-12 DIAGNOSIS — M1711 Unilateral primary osteoarthritis, right knee: Secondary | ICD-10-CM

## 2014-04-12 NOTE — Progress Notes (Signed)
Patient ID: [name], female   DOB: [birth year], 68 y.o.   MRN: [id number]  Chief Complaint  Patient presents with  . Knee Pain    Right knee pain, no injury. Xray today in th eoffice.   HISTORY: 68 year old female presents with one month history of right knee pain sudden onset. Slight tenting slight swelling pain in the posterior part anteromedial part of her right knee. Sharp dull stabbing burning 10 constant pain which is worse with walking. Of trauma.  Review of systems heartburn joint pain and a  Allergies to Cipro and Ancef  History of hypertension thyroid disease high cholesterol acid reflux  Past Surgical History  Procedure Laterality Date  . Cholecystectomy    . Bladder surgery    . Partial hysterectomy    . Umbilical hernia repair    . Tubal ligation    . Esophagogastroduodenoscopy (egd) with esophageal dilation N/A 04/20/2013    Procedure: ESOPHAGOGASTRODUODENOSCOPY (EGD) WITH ESOPHAGEAL DILATION;  Surgeon: Rogene Houston, MD;  [facility];  Service: Endoscopy;  Laterality: N/A;  300-moved to 100 Ann to notify pt  . Colonoscopy N/A 12/27/2013    Procedure: COLONOSCOPY;  Surgeon: Rogene Houston, MD;  Location: [facility];  Service: Endoscopy;  Laterality: N/A;  1030   Vital signs:  BP 141/73  Ht 5' (1.524 m)  Wt 158 lb (71.668 kg)  BMI 30.86 kg/m2  General the patient is well-developed and well-nourished grooming and hygiene are normal Oriented x3 Mood and affect normal Ambulation normal  Inspection of the right knee anteromedial pain along the patellofemoral joint line. Mild medial joint line tenderness. Minimal lateral joint tenderness. McMurray sign is negative joints are stable muscle testing in the quad and hamstring 5 over 5 manual muscle test. Skin clean dry and intact.  Cardiovascular exam is normal Sensory exam normal  Encounter Diagnoses  Name Primary?  . Knee pain   . Osteoarthritis of left knee   . Arthritis of right knee Yes     Knee  Injection Procedure Note  Pre-operative Diagnosis: right knee oa  Post-operative Diagnosis: same  Indications: pain  Anesthesia: ethyl chloride   Procedure Details   Verbal consent was obtained for the procedure. Time out was completed.The joint was prepped with alcohol, followed by  Ethyl chloride spray and A 20 gauge needle was inserted into the knee via lateral approach; 41ml 1% lidocaine and 1 ml of depomedrol  was then injected into the joint . The needle was removed and the area cleansed and dressed.  Complications:  None; patient tolerated the procedure well.  4 wk f/u

## 2014-04-17 ENCOUNTER — Ambulatory Visit (HOSPITAL_COMMUNITY)
Admission: RE | Admit: 2014-04-17 | Discharge: 2014-04-17 | Disposition: A | Discharge status: Home / Self Care | Payer: BC Managed Care – PPO | Source: Ambulatory Visit | Attending: Internal Medicine | Admitting: Internal Medicine

## 2014-04-17 DIAGNOSIS — M949 Disorder of cartilage, unspecified: Principal | ICD-10-CM

## 2014-04-17 DIAGNOSIS — M899 Disorder of bone, unspecified: Secondary | ICD-10-CM | POA: Insufficient documentation

## 2014-04-17 DIAGNOSIS — M8580 Other specified disorders of bone density and structure, unspecified site: Secondary | ICD-10-CM

## 2014-04-17 DIAGNOSIS — Z780 Asymptomatic menopausal state: Secondary | ICD-10-CM | POA: Insufficient documentation

## 2014-04-18 ENCOUNTER — Ambulatory Visit (HOSPITAL_COMMUNITY): Payer: BC Managed Care – PPO

## 2014-05-15 ENCOUNTER — Ambulatory Visit (INDEPENDENT_AMBULATORY_CARE_PROVIDER_SITE_OTHER): Payer: BC Managed Care – PPO | Admitting: Orthopedic Surgery

## 2014-05-15 VITALS — BP 138/74 | Ht 60.0 in | Wt 158.0 lb

## 2014-05-15 DIAGNOSIS — M1712 Unilateral primary osteoarthritis, left knee: Secondary | ICD-10-CM

## 2014-05-15 DIAGNOSIS — IMO0002 Reserved for concepts with insufficient information to code with codable children: Secondary | ICD-10-CM

## 2014-05-15 DIAGNOSIS — M1710 Unilateral primary osteoarthritis, unspecified knee: Secondary | ICD-10-CM

## 2014-05-15 DIAGNOSIS — M1711 Unilateral primary osteoarthritis, right knee: Secondary | ICD-10-CM

## 2014-05-15 NOTE — Patient Instructions (Signed)
You have received a steroid shot. 15% of patients experience increased pain at the injection site with in the next 24 hours. This is best treated with ice and tylenol extra strength 2 tabs every 8 hours. If you are still having pain please call the office.   activities as tolerated

## 2014-05-15 NOTE — Progress Notes (Signed)
Patient ID: [name], female   DOB: Sep 27, 1946, 68 y.o.   MRN: [id number]  Chief Complaint  Patient presents with  . Follow-up    one month recheck right knee    History, status post evaluation of right knee found to have arthritis gave injection did well no pain. Complains of pain left knee status post arthroscopy years ago. She has medial knee pain and aches painful it's worse with walking and activity  Review of systems otherwise normal  BP 138/74  Ht 5' (1.524 m)  Wt 158 lb (71.668 kg)  BMI 30.86 kg/m2 General appearance is normal, the patient is alert and oriented x3 with normal mood and affect. This does not affect her walking. Her right knee is asymptomatic with no tenderness full range of motion no effusion all ligament stable skin normal  Left knee medial joint line pain over the medial femoral condyle and medial joint line. No effusion good strength normal skin  Impression.dx  Encounter Diagnoses  Name Primary?  . Osteoarthritis of left knee Yes  . Arthritis of right knee     Inject left knee  Knee  Injection Procedure Note  Pre-operative Diagnosis: left knee oa  Post-operative Diagnosis: same  Indications: pain  Anesthesia: ethyl chloride   Procedure Details   Verbal consent was obtained for the procedure. Time out was completed.The joint was prepped with alcohol, followed by  Ethyl chloride spray and A 20 gauge needle was inserted into the knee via lateral approach; 67ml 1% lidocaine and 1 ml of depomedrol  was then injected into the joint . The needle was removed and the area cleansed and dressed.  Complications:  None; patient tolerated the procedure well.

## 2014-07-03 ENCOUNTER — Ambulatory Visit: Payer: BC Managed Care – PPO | Admitting: Orthopedic Surgery

## 2015-04-10 ENCOUNTER — Other Ambulatory Visit (HOSPITAL_COMMUNITY): Payer: Self-pay | Admitting: Internal Medicine

## 2015-04-10 DIAGNOSIS — Z1231 Encounter for screening mammogram for malignant neoplasm of breast: Secondary | ICD-10-CM

## 2015-04-15 DIAGNOSIS — Z0000 Encounter for general adult medical examination without abnormal findings: Secondary | ICD-10-CM | POA: Diagnosis not present

## 2015-04-22 DIAGNOSIS — K219 Gastro-esophageal reflux disease without esophagitis: Secondary | ICD-10-CM | POA: Diagnosis not present

## 2015-04-22 DIAGNOSIS — E785 Hyperlipidemia, unspecified: Secondary | ICD-10-CM | POA: Diagnosis not present

## 2015-04-22 DIAGNOSIS — E030 Congenital hypothyroidism with diffuse goiter: Secondary | ICD-10-CM | POA: Diagnosis not present

## 2015-04-22 DIAGNOSIS — Z0001 Encounter for general adult medical examination with abnormal findings: Secondary | ICD-10-CM | POA: Diagnosis not present

## 2015-04-22 DIAGNOSIS — I10 Essential (primary) hypertension: Secondary | ICD-10-CM | POA: Diagnosis not present

## 2015-05-01 ENCOUNTER — Ambulatory Visit (HOSPITAL_COMMUNITY)
Admission: RE | Admit: 2015-05-01 | Discharge: 2015-05-01 | Disposition: A | Discharge status: Home / Self Care | Payer: Medicare Other | Source: Ambulatory Visit | Attending: Internal Medicine | Admitting: Internal Medicine

## 2015-05-01 DIAGNOSIS — Z1231 Encounter for screening mammogram for malignant neoplasm of breast: Secondary | ICD-10-CM | POA: Diagnosis not present

## 2015-10-21 DIAGNOSIS — E039 Hypothyroidism, unspecified: Secondary | ICD-10-CM | POA: Diagnosis not present

## 2015-10-28 DIAGNOSIS — I10 Essential (primary) hypertension: Secondary | ICD-10-CM | POA: Diagnosis not present

## 2015-10-28 DIAGNOSIS — Z6831 Body mass index (BMI) 31.0-31.9, adult: Secondary | ICD-10-CM | POA: Diagnosis not present

## 2015-10-28 DIAGNOSIS — E030 Congenital hypothyroidism with diffuse goiter: Secondary | ICD-10-CM | POA: Diagnosis not present

## 2016-04-08 ENCOUNTER — Other Ambulatory Visit (HOSPITAL_COMMUNITY): Payer: Self-pay | Admitting: Internal Medicine

## 2016-04-08 DIAGNOSIS — Z1231 Encounter for screening mammogram for malignant neoplasm of breast: Secondary | ICD-10-CM

## 2016-04-22 DIAGNOSIS — Z79899 Other long term (current) drug therapy: Secondary | ICD-10-CM | POA: Diagnosis not present

## 2016-04-22 DIAGNOSIS — I10 Essential (primary) hypertension: Secondary | ICD-10-CM | POA: Diagnosis not present

## 2016-04-22 DIAGNOSIS — E785 Hyperlipidemia, unspecified: Secondary | ICD-10-CM | POA: Diagnosis not present

## 2016-04-22 DIAGNOSIS — E039 Hypothyroidism, unspecified: Secondary | ICD-10-CM | POA: Diagnosis not present

## 2016-04-22 DIAGNOSIS — K219 Gastro-esophageal reflux disease without esophagitis: Secondary | ICD-10-CM | POA: Diagnosis not present

## 2016-04-30 DIAGNOSIS — Z0001 Encounter for general adult medical examination with abnormal findings: Secondary | ICD-10-CM | POA: Diagnosis not present

## 2016-04-30 DIAGNOSIS — I10 Essential (primary) hypertension: Secondary | ICD-10-CM | POA: Diagnosis not present

## 2016-04-30 DIAGNOSIS — E785 Hyperlipidemia, unspecified: Secondary | ICD-10-CM | POA: Diagnosis not present

## 2016-05-01 ENCOUNTER — Ambulatory Visit (HOSPITAL_COMMUNITY): Payer: Self-pay

## 2016-05-06 ENCOUNTER — Ambulatory Visit (HOSPITAL_COMMUNITY)
Admission: RE | Admit: 2016-05-06 | Discharge: 2016-05-06 | Disposition: A | Discharge status: Home / Self Care | Payer: Medicare Other | Source: Ambulatory Visit | Attending: Internal Medicine | Admitting: Internal Medicine

## 2016-05-06 DIAGNOSIS — Z1231 Encounter for screening mammogram for malignant neoplasm of breast: Secondary | ICD-10-CM | POA: Diagnosis not present

## 2016-11-03 DIAGNOSIS — K219 Gastro-esophageal reflux disease without esophagitis: Secondary | ICD-10-CM | POA: Diagnosis not present

## 2016-11-03 DIAGNOSIS — I10 Essential (primary) hypertension: Secondary | ICD-10-CM | POA: Diagnosis not present

## 2017-03-12 ENCOUNTER — Other Ambulatory Visit: Payer: Self-pay | Admitting: Pharmacy Technician

## 2017-03-15 NOTE — Patient Outreach (Signed)
Tried to contact [name] for medication barriers, No answer, will try to recall in future

## 2017-03-25 ENCOUNTER — Other Ambulatory Visit (HOSPITAL_COMMUNITY): Payer: Self-pay | Admitting: Internal Medicine

## 2017-03-25 DIAGNOSIS — Z1231 Encounter for screening mammogram for malignant neoplasm of breast: Secondary | ICD-10-CM

## 2017-04-27 DIAGNOSIS — E039 Hypothyroidism, unspecified: Secondary | ICD-10-CM | POA: Diagnosis not present

## 2017-04-27 DIAGNOSIS — E785 Hyperlipidemia, unspecified: Secondary | ICD-10-CM | POA: Diagnosis not present

## 2017-04-27 DIAGNOSIS — K219 Gastro-esophageal reflux disease without esophagitis: Secondary | ICD-10-CM | POA: Diagnosis not present

## 2017-04-27 DIAGNOSIS — Z79899 Other long term (current) drug therapy: Secondary | ICD-10-CM | POA: Diagnosis not present

## 2017-04-27 DIAGNOSIS — I10 Essential (primary) hypertension: Secondary | ICD-10-CM | POA: Diagnosis not present

## 2017-05-04 DIAGNOSIS — M810 Age-related osteoporosis without current pathological fracture: Secondary | ICD-10-CM | POA: Diagnosis not present

## 2017-05-04 DIAGNOSIS — Z0001 Encounter for general adult medical examination with abnormal findings: Secondary | ICD-10-CM | POA: Diagnosis not present

## 2017-05-04 DIAGNOSIS — I10 Essential (primary) hypertension: Secondary | ICD-10-CM | POA: Diagnosis not present

## 2017-05-07 ENCOUNTER — Ambulatory Visit (HOSPITAL_COMMUNITY)
Admission: RE | Admit: 2017-05-07 | Discharge: 2017-05-07 | Disposition: A | Discharge status: Home / Self Care | Payer: Medicare Other | Source: Ambulatory Visit | Attending: Internal Medicine | Admitting: Internal Medicine

## 2017-05-07 DIAGNOSIS — Z1231 Encounter for screening mammogram for malignant neoplasm of breast: Secondary | ICD-10-CM | POA: Diagnosis not present

## 2017-06-03 DIAGNOSIS — L820 Inflamed seborrheic keratosis: Secondary | ICD-10-CM | POA: Diagnosis not present

## 2017-11-11 DIAGNOSIS — M810 Age-related osteoporosis without current pathological fracture: Secondary | ICD-10-CM | POA: Diagnosis not present

## 2017-11-11 DIAGNOSIS — I10 Essential (primary) hypertension: Secondary | ICD-10-CM | POA: Diagnosis not present

## 2017-11-20 ENCOUNTER — Encounter (HOSPITAL_COMMUNITY): Payer: Self-pay | Admitting: Emergency Medicine

## 2017-11-20 ENCOUNTER — Emergency Department (HOSPITAL_COMMUNITY)
Admission: EM | Admit: 2017-11-20 | Discharge: 2017-11-20 | Disposition: A | Discharge status: Home / Self Care | Payer: Medicare Other | Attending: Emergency Medicine | Admitting: Emergency Medicine

## 2017-11-20 ENCOUNTER — Other Ambulatory Visit: Payer: Self-pay

## 2017-11-20 DIAGNOSIS — Z7982 Long term (current) use of aspirin: Secondary | ICD-10-CM | POA: Insufficient documentation

## 2017-11-20 DIAGNOSIS — E039 Hypothyroidism, unspecified: Secondary | ICD-10-CM | POA: Insufficient documentation

## 2017-11-20 DIAGNOSIS — Z79899 Other long term (current) drug therapy: Secondary | ICD-10-CM | POA: Diagnosis not present

## 2017-11-20 DIAGNOSIS — B029 Zoster without complications: Secondary | ICD-10-CM | POA: Diagnosis not present

## 2017-11-20 DIAGNOSIS — I10 Essential (primary) hypertension: Secondary | ICD-10-CM | POA: Diagnosis not present

## 2017-11-20 DIAGNOSIS — R21 Rash and other nonspecific skin eruption: Secondary | ICD-10-CM | POA: Diagnosis present

## 2017-11-20 MED ORDER — ACYCLOVIR 800 MG PO TABS
800.0000 mg | ORAL_TABLET | Freq: Once | ORAL | Status: AC
  Administered 2017-11-20: 800 mg via ORAL
  Filled 2017-11-20: qty 1

## 2017-11-20 MED ORDER — ACYCLOVIR 800 MG PO TABS
800.0000 mg | ORAL_TABLET | Freq: Every day | ORAL | 0 refills | Status: DC
Start: 2017-11-20 — End: 2021-07-24

## 2017-11-20 MED ORDER — HYDROCODONE-ACETAMINOPHEN 5-325 MG PO TABS
1.0000 | ORAL_TABLET | Freq: Once | ORAL | Status: AC
Start: 2017-11-20 — End: 2017-11-20
  Administered 2017-11-20: 1 via ORAL
  Filled 2017-11-20: qty 1

## 2017-11-20 MED ORDER — HYDROCODONE-ACETAMINOPHEN 5-325 MG PO TABS: 1.0000 | ORAL_TABLET | ORAL | 0 refills | Status: DC | PRN

## 2017-11-20 MED ORDER — ACYCLOVIR 800 MG PO TABS
ORAL_TABLET | ORAL | Status: AC
  Filled 2017-11-20: qty 1

## 2017-11-20 NOTE — ED Provider Notes (Signed)
[facility] EMERGENCY DEPARTMENT Provider Note   CSN: [id number] Arrival date & time: 11/20/17  7209     History   Chief Complaint Chief Complaint  Patient presents with  . Rash    HPI [name] is a 71 y.o. female.  The history is provided by the patient.  Rash   This is a new problem. Episode onset: developed pain in the right shoulder 3 days ago, rash started yesterday. The problem has been gradually worsening. The problem is associated with nothing. There has been no fever. Affected Location: right shoulder and across upper shoulder to neck. The pain is at a severity of 8/10. The pain is moderate. The pain has been constant since onset. Associated symptoms include blisters. Treatments tried: topical diclofenac. The treatment provided no relief.    Past Medical History:  Diagnosis Date  . Acid reflux   . HTN (hypertension)   . Hypothyroidism   . Sleep apnea    Stop Bang Cock score of 4  . Thyroid disease     Patient Active Problem List   Diagnosis Date Noted  . Arthritis of right knee 04/12/2014  . Dysphagia, unspecified(787.20) 03/23/2013  . GERD (gastroesophageal reflux disease) 03/23/2013  . S/P arthroscopy of left knee 09/21/2012  . Knee pain 08/04/2012  . Knee stiffness 08/04/2012  . Lateral meniscus derangement 08/01/2012  . Stress fracture of left tibia 06/06/2012  . Osteoarthritis of left knee 06/06/2012  . Medial meniscus, posterior horn derangement 06/06/2012    Past Surgical History:  Procedure Laterality Date  . BLADDER SURGERY    . CHOLECYSTECTOMY    . COLONOSCOPY N/A 12/27/2013   Procedure: COLONOSCOPY;  Surgeon: Rogene Houston, MD;  Location: [facility];  Service: Endoscopy;  Laterality: N/A;  1030  . ESOPHAGOGASTRODUODENOSCOPY (EGD) WITH ESOPHAGEAL DILATION N/A 04/20/2013   Procedure: ESOPHAGOGASTRODUODENOSCOPY (EGD) WITH ESOPHAGEAL DILATION;  Surgeon: Rogene Houston, MD;  Location: [facility];  Service: Endoscopy;  Laterality: N/A;   300-moved to 100 Ann to notify pt  . PARTIAL HYSTERECTOMY    . TUBAL LIGATION    . UMBILICAL HERNIA REPAIR      OB History    Gravida Para Term Preterm AB Living   4         3   SAB TAB Ectopic Multiple Live Births                   Home Medications    Prior to Admission medications   Medication Sig Start Date End Date Taking? Authorizing Provider  acyclovir (ZOVIRAX) 800 MG tablet Take 1 tablet (800 mg total) by mouth 5 (five) times daily. 11/20/17   Evalee Jefferson, PA-C  ALPRAZolam Duanne Moron) 0.25 MG tablet Take 0.25 mg by mouth at bedtime as needed. For sleep    [provider]  aspirin EC 81 MG tablet Take 81 mg by mouth daily.    [provider]  B Complex Vitamins (B-COMPLEX/B-12 SL) Place 1 tablet under the tongue every morning.     [provider]  BIOTIN PO Take 2,500 mg by mouth.     [provider]  Calcium-Vitamin D-Vitamin K (VIACTIV PO) Take 1 tablet by mouth every morning.     [provider]  HYDROcodone-acetaminophen (NORCO/VICODIN) 5-325 MG tablet Take 1 tablet by mouth every 4 (four) hours as needed. 11/20/17   Evalee Jefferson, PA-C  levothyroxine (SYNTHROID, LEVOTHROID) 75 MCG tablet Take 75 mcg by mouth daily before breakfast.  [provider]  lisinopril-hydrochlorothiazide (PRINZIDE,ZESTORETIC) 20-12.5 MG per tablet Take 1 tablet by mouth every morning.     [provider]  lovastatin (MEVACOR) 20 MG tablet Take 20 mg by mouth at bedtime.    [provider]  Magnesium 250 MG TABS Take 1 tablet by mouth every morning.    [provider]  naproxen sodium (ANAPROX) 220 MG tablet Take 220 mg by mouth 2 (two) times daily with a meal. One a day    [provider]  omeprazole (PRILOSEC) 20 MG capsule Take 20 mg by mouth every morning.     [provider]    Family History Family History  Problem Relation Age of Onset  . Heart disease Mother   . Emphysema Father   . Breast  cancer Sister   . Heart disease Brother   . Heart disease Brother   . Heart disease Sister     Social History Social History   Tobacco Use  . Smoking status: Never Smoker  . Smokeless tobacco: Never Used  Substance Use Topics  . Alcohol use: No  . Drug use: No     Allergies   Ancef [cefazolin] and Ciprofloxacin   Review of Systems Review of Systems  Constitutional: Negative for chills and fever.  Respiratory: Negative for shortness of breath and wheezing.   Musculoskeletal: Positive for arthralgias.  Skin: Positive for rash.  Neurological: Negative for numbness.     Physical Exam Updated Vital Signs BP (!) 188/84 (BP Location: Left Arm)   Pulse 90   Temp 97.7 F (36.5 C) (Oral)   Resp 20   Ht 5' (1.524 m)   Wt 72.6 kg (160 lb)   SpO2 100%   BMI 31.25 kg/m   Physical Exam  Skin: Rash noted. Rash is vesicular.  3 small patches of erythematous intact vesicles at the right anterior shoulder.  Tenderness of the skin from this location through her upper shoulder to cervical spine midline.     ED Treatments / Results  Labs (all labs ordered are listed, but only abnormal results are displayed) Labs Reviewed - No data to display  EKG  EKG Interpretation None       Radiology No results found.  Procedures Procedures (including critical care time)  Medications Ordered in ED Medications  acyclovir (ZOVIRAX) 200 MG capsule 800 mg (not administered)  HYDROcodone-acetaminophen (NORCO/VICODIN) 5-325 MG per tablet 1 tablet (not administered)     Initial Impression / Assessment and Plan / ED Course  I have reviewed the triage vital signs and the nursing notes.  Pertinent labs & imaging results that were available during my care of the patient were reviewed by me and considered in my medical decision making (see chart for details).     Patient with first outbreak of shingles.  She was given information regarding this condition.  She was started on  acyclovir and hydrocodone, caution regarding sedation.  She may also consider trying a capsulation cream which may help with pain.  Follow-up with PCP in 1 week if symptoms are not improving.  Final Clinical Impressions(s) / ED Diagnoses   Final diagnoses:  Herpes zoster without complication    ED Discharge Orders        Ordered    acyclovir (ZOVIRAX) 800 MG tablet  5 times daily     11/20/17 0824    HYDROcodone-acetaminophen (NORCO/VICODIN) 5-325 MG tablet  Every 4 hours PRN     11/20/17 0824  Evalee Jefferson, PA-C 11/20/17 [place], [place], DO 11/20/17 1442

## 2017-11-20 NOTE — Discharge Instructions (Signed)
Take 4 more doses of the acyclovir today as you have had your first dose.  You may take the hydrocodone prescribed for pain relief.  This will make you drowsy - do not drive within 4 hours of taking this medication. I would use caution taking ibuprofen as your blood pressure is elevated this morning (probably due to pain as we discussed) but this medicine can elevate blood pressure.  You may try topical capsaicin cream which may provide pain relief.

## 2017-11-20 NOTE — ED Triage Notes (Signed)
Pt c/o of blistering rash on right shoulder for 3 days. 8/10 pain.

## 2017-11-25 ENCOUNTER — Other Ambulatory Visit: Payer: Self-pay

## 2017-11-25 NOTE — Patient Outreach (Signed)
[facility]) Care Management  11/25/2017  ARLEE BOSSARD 1946-03-04 [id number]   Telephone call to patient for ED Utilization report.  Spoke with patient.  She is able to verify HIPAA. Patient reports she is doing ok and that she is taking her medication as ordered for her shingles and knows it has to run its course.  She has been in contact with her primary care physician and that she was advised that if area does not clear in about a week to call them back and they would see her.  Discussed with patient [place] and patient states she has no needs at this time.  Advised patient that I would send letter and brochure for future reference.  She is agreeable.   Plan: RN CM will send letter and brochure and close case.    Jone Baseman, RN, MSN [facility] Care Management Care Management Coordinator Direct Line [phone] Toll Free: [phone]  Fax: [phone]

## 2018-04-25 ENCOUNTER — Other Ambulatory Visit (HOSPITAL_COMMUNITY): Payer: Self-pay | Admitting: Internal Medicine

## 2018-04-25 DIAGNOSIS — Z1231 Encounter for screening mammogram for malignant neoplasm of breast: Secondary | ICD-10-CM

## 2018-05-16 ENCOUNTER — Ambulatory Visit (HOSPITAL_COMMUNITY): Payer: Self-pay

## 2018-05-18 ENCOUNTER — Ambulatory Visit (HOSPITAL_COMMUNITY)
Admission: RE | Admit: 2018-05-18 | Discharge: 2018-05-18 | Disposition: A | Discharge status: Home / Self Care | Payer: Medicare Other | Source: Ambulatory Visit | Attending: Internal Medicine | Admitting: Internal Medicine

## 2018-05-18 DIAGNOSIS — Z1231 Encounter for screening mammogram for malignant neoplasm of breast: Secondary | ICD-10-CM | POA: Insufficient documentation

## 2018-06-13 DIAGNOSIS — E785 Hyperlipidemia, unspecified: Secondary | ICD-10-CM | POA: Diagnosis not present

## 2018-06-13 DIAGNOSIS — M1990 Unspecified osteoarthritis, unspecified site: Secondary | ICD-10-CM | POA: Diagnosis not present

## 2018-06-13 DIAGNOSIS — I10 Essential (primary) hypertension: Secondary | ICD-10-CM | POA: Diagnosis not present

## 2018-06-13 DIAGNOSIS — Z79899 Other long term (current) drug therapy: Secondary | ICD-10-CM | POA: Diagnosis not present

## 2018-06-13 DIAGNOSIS — E039 Hypothyroidism, unspecified: Secondary | ICD-10-CM | POA: Diagnosis not present

## 2018-06-20 DIAGNOSIS — R079 Chest pain, unspecified: Secondary | ICD-10-CM | POA: Diagnosis not present

## 2018-06-20 DIAGNOSIS — Z0001 Encounter for general adult medical examination with abnormal findings: Secondary | ICD-10-CM | POA: Diagnosis not present

## 2018-06-20 DIAGNOSIS — E039 Hypothyroidism, unspecified: Secondary | ICD-10-CM | POA: Diagnosis not present

## 2018-06-20 DIAGNOSIS — E785 Hyperlipidemia, unspecified: Secondary | ICD-10-CM | POA: Diagnosis not present

## 2018-12-26 DIAGNOSIS — I10 Essential (primary) hypertension: Secondary | ICD-10-CM | POA: Diagnosis not present

## 2018-12-26 DIAGNOSIS — N390 Urinary tract infection, site not specified: Secondary | ICD-10-CM | POA: Diagnosis not present

## 2019-05-02 ENCOUNTER — Other Ambulatory Visit (HOSPITAL_COMMUNITY): Payer: Self-pay | Admitting: Internal Medicine

## 2019-05-02 DIAGNOSIS — Z1231 Encounter for screening mammogram for malignant neoplasm of breast: Secondary | ICD-10-CM

## 2019-06-22 ENCOUNTER — Other Ambulatory Visit: Payer: Self-pay

## 2019-06-22 ENCOUNTER — Ambulatory Visit (HOSPITAL_COMMUNITY)
Admission: RE | Admit: 2019-06-22 | Discharge: 2019-06-22 | Disposition: A | Discharge status: Home / Self Care | Payer: Medicare Other | Source: Ambulatory Visit | Attending: Internal Medicine | Admitting: Internal Medicine

## 2019-06-22 DIAGNOSIS — Z1231 Encounter for screening mammogram for malignant neoplasm of breast: Secondary | ICD-10-CM | POA: Insufficient documentation

## 2019-07-10 DIAGNOSIS — M1990 Unspecified osteoarthritis, unspecified site: Secondary | ICD-10-CM | POA: Diagnosis not present

## 2019-07-10 DIAGNOSIS — K219 Gastro-esophageal reflux disease without esophagitis: Secondary | ICD-10-CM | POA: Diagnosis not present

## 2019-07-10 DIAGNOSIS — E039 Hypothyroidism, unspecified: Secondary | ICD-10-CM | POA: Diagnosis not present

## 2019-07-10 DIAGNOSIS — Z79899 Other long term (current) drug therapy: Secondary | ICD-10-CM | POA: Diagnosis not present

## 2019-07-10 DIAGNOSIS — I10 Essential (primary) hypertension: Secondary | ICD-10-CM | POA: Diagnosis not present

## 2019-07-17 DIAGNOSIS — E039 Hypothyroidism, unspecified: Secondary | ICD-10-CM | POA: Diagnosis not present

## 2019-07-17 DIAGNOSIS — E785 Hyperlipidemia, unspecified: Secondary | ICD-10-CM | POA: Diagnosis not present

## 2019-07-17 DIAGNOSIS — I10 Essential (primary) hypertension: Secondary | ICD-10-CM | POA: Diagnosis not present

## 2019-09-28 ENCOUNTER — Other Ambulatory Visit: Payer: Self-pay

## 2019-09-28 NOTE — Patient Outreach (Signed)
[facility]) Care Management  09/28/2019  [name] [birth year] [id number]   Medication Adherence call to [name] Hippa Identifiers Verify spoke with patient she is past due on Lovastatin 20 mg patient explain she is taking 1 tablet daily,patient has medication at this time and will order when due, patient explain at some time she got ahead and end up with more.[name] is showing past due on under [place].    [place] Management Direct Dial [phone]  Fax [phone] [email or web address]

## 2019-10-17 ENCOUNTER — Other Ambulatory Visit: Payer: Self-pay

## 2019-10-17 NOTE — Patient Outreach (Signed)
[facility]) Care Management  10/17/2019  [name] July 13, 1946 [id number]   Medication Adherence call to [name] Hippa Identifiers Verify spoke with patient she is past due on Lovastatin 20 mg and Lisinopril/Hctz 20/12.5 mg,patient explain she takes both medication on Lisinopril /Hctz she was taking 2 tablets daily but now she is only taking it once daily and has plenty at list for 2 more month on Lovastatin she explain she has enough for another month and will order when due. [name] is showing past due under [place].   [place] Management Direct Dial [phone]  Fax [phone] [email or web address]

## 2020-01-24 DIAGNOSIS — I10 Essential (primary) hypertension: Secondary | ICD-10-CM | POA: Diagnosis not present

## 2020-01-24 DIAGNOSIS — R7303 Prediabetes: Secondary | ICD-10-CM | POA: Diagnosis not present

## 2020-05-15 ENCOUNTER — Other Ambulatory Visit (HOSPITAL_COMMUNITY): Payer: Self-pay | Admitting: Internal Medicine

## 2020-05-15 DIAGNOSIS — Z1231 Encounter for screening mammogram for malignant neoplasm of breast: Secondary | ICD-10-CM

## 2020-06-13 DIAGNOSIS — D225 Melanocytic nevi of trunk: Secondary | ICD-10-CM | POA: Diagnosis not present

## 2020-06-13 DIAGNOSIS — L820 Inflamed seborrheic keratosis: Secondary | ICD-10-CM | POA: Diagnosis not present

## 2020-06-24 ENCOUNTER — Ambulatory Visit (HOSPITAL_COMMUNITY)
Admission: RE | Admit: 2020-06-24 | Discharge: 2020-06-24 | Disposition: A | Discharge status: Home / Self Care | Payer: Medicare Other | Source: Ambulatory Visit | Attending: Internal Medicine | Admitting: Internal Medicine

## 2020-06-24 ENCOUNTER — Other Ambulatory Visit: Payer: Self-pay

## 2020-06-24 DIAGNOSIS — Z1231 Encounter for screening mammogram for malignant neoplasm of breast: Secondary | ICD-10-CM

## 2020-07-16 DIAGNOSIS — E785 Hyperlipidemia, unspecified: Secondary | ICD-10-CM | POA: Diagnosis not present

## 2020-07-16 DIAGNOSIS — I10 Essential (primary) hypertension: Secondary | ICD-10-CM | POA: Diagnosis not present

## 2020-07-16 DIAGNOSIS — E039 Hypothyroidism, unspecified: Secondary | ICD-10-CM | POA: Diagnosis not present

## 2020-07-16 DIAGNOSIS — K219 Gastro-esophageal reflux disease without esophagitis: Secondary | ICD-10-CM | POA: Diagnosis not present

## 2020-07-16 DIAGNOSIS — Z79899 Other long term (current) drug therapy: Secondary | ICD-10-CM | POA: Diagnosis not present

## 2020-07-23 ENCOUNTER — Telehealth: Payer: Self-pay | Admitting: *Deleted

## 2020-07-23 DIAGNOSIS — R002 Palpitations: Secondary | ICD-10-CM | POA: Diagnosis not present

## 2020-07-23 DIAGNOSIS — E785 Hyperlipidemia, unspecified: Secondary | ICD-10-CM | POA: Diagnosis not present

## 2020-07-23 DIAGNOSIS — Z0001 Encounter for general adult medical examination with abnormal findings: Secondary | ICD-10-CM | POA: Diagnosis not present

## 2020-07-23 DIAGNOSIS — E039 Hypothyroidism, unspecified: Secondary | ICD-10-CM | POA: Diagnosis not present

## 2020-07-23 DIAGNOSIS — I10 Essential (primary) hypertension: Secondary | ICD-10-CM | POA: Diagnosis not present

## 2020-07-23 NOTE — Telephone Encounter (Signed)
holter monitor from Dr. Ria Comment office.

## 2020-07-25 ENCOUNTER — Ambulatory Visit: Payer: Medicare Other

## 2020-07-25 DIAGNOSIS — R002 Palpitations: Secondary | ICD-10-CM

## 2020-08-09 DIAGNOSIS — R002 Palpitations: Secondary | ICD-10-CM | POA: Diagnosis not present

## 2020-08-12 ENCOUNTER — Other Ambulatory Visit: Payer: Self-pay

## 2020-08-12 DIAGNOSIS — R002 Palpitations: Secondary | ICD-10-CM

## 2021-01-23 ENCOUNTER — Telehealth: Payer: Self-pay | Admitting: *Deleted

## 2021-01-23 DIAGNOSIS — I10 Essential (primary) hypertension: Secondary | ICD-10-CM | POA: Diagnosis not present

## 2021-01-23 DIAGNOSIS — I471 Supraventricular tachycardia: Secondary | ICD-10-CM | POA: Diagnosis not present

## 2021-01-23 DIAGNOSIS — R002 Palpitations: Secondary | ICD-10-CM

## 2021-01-23 NOTE — Telephone Encounter (Signed)
Received faxed order for 30 day event monitor to eval. SVT. Order placed. Called to notify pt left msg with husband.

## 2021-01-28 ENCOUNTER — Ambulatory Visit (INDEPENDENT_AMBULATORY_CARE_PROVIDER_SITE_OTHER): Payer: Medicare Other

## 2021-01-28 DIAGNOSIS — I471 Supraventricular tachycardia: Secondary | ICD-10-CM

## 2021-01-28 DIAGNOSIS — R002 Palpitations: Secondary | ICD-10-CM

## 2021-01-29 ENCOUNTER — Other Ambulatory Visit: Payer: Self-pay

## 2021-03-13 DIAGNOSIS — E785 Hyperlipidemia, unspecified: Secondary | ICD-10-CM | POA: Diagnosis not present

## 2021-03-13 DIAGNOSIS — K219 Gastro-esophageal reflux disease without esophagitis: Secondary | ICD-10-CM | POA: Diagnosis not present

## 2021-03-13 DIAGNOSIS — I10 Essential (primary) hypertension: Secondary | ICD-10-CM | POA: Diagnosis not present

## 2021-05-20 ENCOUNTER — Other Ambulatory Visit (HOSPITAL_COMMUNITY): Payer: Self-pay | Admitting: Internal Medicine

## 2021-05-20 DIAGNOSIS — Z1231 Encounter for screening mammogram for malignant neoplasm of breast: Secondary | ICD-10-CM

## 2021-06-12 DIAGNOSIS — E785 Hyperlipidemia, unspecified: Secondary | ICD-10-CM | POA: Diagnosis not present

## 2021-06-12 DIAGNOSIS — I10 Essential (primary) hypertension: Secondary | ICD-10-CM | POA: Diagnosis not present

## 2021-06-12 DIAGNOSIS — K219 Gastro-esophageal reflux disease without esophagitis: Secondary | ICD-10-CM | POA: Diagnosis not present

## 2021-06-13 DIAGNOSIS — C50911 Malignant neoplasm of unspecified site of right female breast: Secondary | ICD-10-CM

## 2021-06-13 HISTORY — DX: Malignant neoplasm of unspecified site of right female breast: C50.911

## 2021-06-19 DIAGNOSIS — L821 Other seborrheic keratosis: Secondary | ICD-10-CM | POA: Diagnosis not present

## 2021-06-19 DIAGNOSIS — D225 Melanocytic nevi of trunk: Secondary | ICD-10-CM | POA: Diagnosis not present

## 2021-06-26 ENCOUNTER — Other Ambulatory Visit: Payer: Self-pay

## 2021-06-26 ENCOUNTER — Ambulatory Visit (HOSPITAL_COMMUNITY)
Admission: RE | Admit: 2021-06-26 | Discharge: 2021-06-26 | Disposition: A | Discharge status: Home / Self Care | Payer: Medicare Other | Source: Ambulatory Visit | Attending: Internal Medicine | Admitting: Internal Medicine

## 2021-06-26 DIAGNOSIS — Z1231 Encounter for screening mammogram for malignant neoplasm of breast: Secondary | ICD-10-CM

## 2021-07-01 ENCOUNTER — Other Ambulatory Visit (HOSPITAL_COMMUNITY): Payer: Self-pay | Admitting: Internal Medicine

## 2021-07-01 DIAGNOSIS — R928 Other abnormal and inconclusive findings on diagnostic imaging of breast: Secondary | ICD-10-CM

## 2021-07-08 ENCOUNTER — Ambulatory Visit (HOSPITAL_COMMUNITY)
Admission: RE | Admit: 2021-07-08 | Discharge: 2021-07-08 | Disposition: A | Discharge status: Home / Self Care | Payer: Medicare Other | Source: Ambulatory Visit | Attending: Internal Medicine | Admitting: Internal Medicine

## 2021-07-08 ENCOUNTER — Other Ambulatory Visit (HOSPITAL_COMMUNITY): Payer: Self-pay | Admitting: Internal Medicine

## 2021-07-08 ENCOUNTER — Other Ambulatory Visit: Payer: Self-pay

## 2021-07-08 DIAGNOSIS — R928 Other abnormal and inconclusive findings on diagnostic imaging of breast: Secondary | ICD-10-CM

## 2021-07-11 ENCOUNTER — Other Ambulatory Visit (HOSPITAL_COMMUNITY): Payer: Self-pay | Admitting: Internal Medicine

## 2021-07-11 ENCOUNTER — Ambulatory Visit (HOSPITAL_COMMUNITY)
Admission: RE | Admit: 2021-07-11 | Discharge: 2021-07-11 | Disposition: A | Discharge status: Home / Self Care | Payer: Medicare Other | Source: Ambulatory Visit | Attending: Internal Medicine | Admitting: Internal Medicine

## 2021-07-11 ENCOUNTER — Other Ambulatory Visit: Payer: Self-pay

## 2021-07-11 DIAGNOSIS — R928 Other abnormal and inconclusive findings on diagnostic imaging of breast: Secondary | ICD-10-CM

## 2021-07-11 DIAGNOSIS — N6314 Unspecified lump in the right breast, lower inner quadrant: Secondary | ICD-10-CM | POA: Diagnosis not present

## 2021-07-11 DIAGNOSIS — C50311 Malignant neoplasm of lower-inner quadrant of right female breast: Secondary | ICD-10-CM | POA: Diagnosis not present

## 2021-07-11 MED ORDER — LIDOCAINE-EPINEPHRINE (PF) 2 %-1:200000 IJ SOLN
INTRAMUSCULAR | Status: AC
  Filled 2021-07-11: qty 10

## 2021-07-16 LAB — SURGICAL PATHOLOGY

## 2021-07-18 DIAGNOSIS — E785 Hyperlipidemia, unspecified: Secondary | ICD-10-CM | POA: Diagnosis not present

## 2021-07-18 DIAGNOSIS — R002 Palpitations: Secondary | ICD-10-CM | POA: Diagnosis not present

## 2021-07-18 DIAGNOSIS — E039 Hypothyroidism, unspecified: Secondary | ICD-10-CM | POA: Diagnosis not present

## 2021-07-18 DIAGNOSIS — K219 Gastro-esophageal reflux disease without esophagitis: Secondary | ICD-10-CM | POA: Diagnosis not present

## 2021-07-18 DIAGNOSIS — I10 Essential (primary) hypertension: Secondary | ICD-10-CM | POA: Diagnosis not present

## 2021-07-24 ENCOUNTER — Encounter: Payer: Self-pay | Admitting: General Surgery

## 2021-07-24 ENCOUNTER — Ambulatory Visit: Payer: Medicare Other | Admitting: General Surgery

## 2021-07-24 ENCOUNTER — Other Ambulatory Visit (HOSPITAL_COMMUNITY): Payer: Self-pay | Admitting: General Surgery

## 2021-07-24 ENCOUNTER — Other Ambulatory Visit: Payer: Self-pay

## 2021-07-24 VITALS — BP 143/81 | HR 88 | Temp 97.9°F | Resp 16 | Ht 60.0 in | Wt 154.8 lb

## 2021-07-24 DIAGNOSIS — C50311 Malignant neoplasm of lower-inner quadrant of right female breast: Secondary | ICD-10-CM | POA: Diagnosis not present

## 2021-07-24 DIAGNOSIS — R928 Other abnormal and inconclusive findings on diagnostic imaging of breast: Secondary | ICD-10-CM

## 2021-07-24 NOTE — Patient Instructions (Signed)
[name]  07/24/2021     '@PREFPERIOPPHARMACY'$ @   Your procedure is scheduled on  08/04/2021.   Report to [facility] at  [place].M.   Call this number if you have problems the morning of surgery:  [phone]   Remember:  Do not eat or drink after midnight.      Take these medicines the morning of surgery with A SIP OF WATER                    levothyroxine, prilosec.     Do not wear jewelry, make-up or nail polish.  Do not wear lotions, powders, or perfumes, or deodorant.  Do not shave 48 hours prior to surgery.  Men may shave face and neck.  Do not bring valuables to the hospital.  [facility] is not responsible for any belongings or valuables.  Contacts, dentures or bridgework may not be worn into surgery.  Leave your suitcase in the car.  After surgery it may be brought to your room.  For patients admitted to the hospital, discharge time will be determined by your treatment team.  Patients discharged the day of surgery will not be allowed to drive home and must have someone with them for 24 hours.    Special instructions:   DO NOT smoke tobacco or vape for 24 hours before your procedure.  Please read over the following fact sheets that you were given. Coughing and Deep Breathing, Surgical Site Infection Prevention, Anesthesia Post-op Instructions, and Care and Recovery After Surgery      Lumpectomy, Care After This sheet gives you information about how to care for yourself after your procedure. Your health care provider may also give you more specific instructions. If you have problems or questions, contact your health careprovider. What can I expect after the procedure? After the procedure, it is common to have: Breast swelling. Breast tenderness. Stiffness in your arm or shoulder. A change in the shape and feel of your breast. Scar tissue that feels hard to the touch in the area where the lump was removed. Follow these instructions at  home: Medicines Take over-the-counter and prescription medicines only as told by your health care provider. If you were prescribed an antibiotic medicine, take it as told by your health care provider. Do not stop taking the antibiotic even if you start to feel better. Ask your health care provider if the medicine prescribed to you: Requires you to avoid driving or using heavy machinery. Can cause constipation. You may need to take these actions to prevent or treat constipation: Drink enough fluid to keep your urine pale yellow. Take over-the-counter or prescription medicines. Eat foods that are high in fiber, such as beans, whole grains, and fresh fruits and vegetables. Limit foods that are high in fat and processed sugars, such as fried or sweet foods. Incision care     Follow instructions from your health care provider about how to take care of your incision. Make sure you: Wash your hands with soap and water before and after you change your bandage (dressing). If soap and water are not available, use hand sanitizer. Change your dressing as told by your health care provider. Leave stitches (sutures), skin glue, or adhesive strips in place. These skin closures may need to stay in place for 2 weeks or longer. If adhesive strip edges start to loosen and curl up, you may trim the loose edges. Do not  remove adhesive strips completely unless your health care provider tells you to do that. Check your incision area every day for signs of infection. Check for: More redness, swelling, or pain. Fluid or blood. Warmth. Pus or a bad smell. Keep your dressing clean and dry. If you were sent home with a surgical drain in place, follow instructions from your health care provider about emptying it. Bathing Do not take baths, swim, or use a hot tub until your health care provider approves. Ask your health care provider if you may take showers. You may only be allowed to take sponge baths. Activity Rest  as told by your health care provider. Avoid sitting for a long time without moving. Get up to take short walks every 1-2 hours. This is important to improve blood flow and breathing. Ask for help if you feel weak or unsteady. Return to your normal activities as told by your health care provider. Ask your health care provider what activities are safe for you. Be careful to avoid any activities that could cause an injury to your arm on the side of your surgery. Do not lift anything that is heavier than 10 lb (4.5 kg), or the limit that you are told, until your health care provider says that it is safe. Avoid lifting with the arm that is on the side of your surgery. Do not carry heavy objects on your shoulder on the side of your surgery. Do exercises to keep your shoulder and arm from getting stiff and swollen. Talk with your health care provider about which exercises are safe for you. General instructions Wear a supportive bra as told by your health care provider. Raise (elevate) your arm above the level of your heart while you are sitting or lying down. Do not wear tight jewelry on your arm, wrist, or fingers on the side of your surgery. Keep all follow-up visits as told by your health care provider. This is important. You may need to be screened for extra fluid around the lymph nodes and swelling in the breast and arm (lymphedema). Follow instructions from your health care provider about how often you should be checked. If you had any lymph nodes removed during your procedure, be sure to tell all of your health care providers. This is important information to share before you are involved in certain procedures, such as having blood tests or having your blood pressure taken. Contact a health care provider if: You develop a rash. You have a fever. Your pain medicine is not working. You have swelling, weakness, or numbness in your arm that does not improve after a few weeks. You have new swelling in  your breast. You have any of these signs of infection: More redness, swelling, or pain in your incision area. Fluid or blood coming from your incision. Warmth coming from the incision area. Pus or a bad smell coming from your incision. Get help right away if you have: Very bad pain in your breast or arm. Swelling in your legs or arms. Redness, warmth, or pain in your leg or arm. Chest pain. Difficulty breathing. Summary After the procedure, it is common to have breast tenderness, swelling in your breast, and stiffness in your arm and shoulder. Follow instructions from your health care provider about how to take care of your incision. Do not lift anything that is heavier than 10 lb (4.5 kg), or the limit that you are told, until your health care provider says that it is safe. Avoid lifting with  the arm that is on the side of your surgery. If you had any lymph nodes removed during your procedure, be sure to tell all of your health care providers. This is important information to share before you are involved in certain procedures, such as having blood tests or having your blood pressure taken. This information is not intended to replace advice given to you by your health care provider. Make sure you discuss any questions you have with your healthcare provider. Document Revised: 06/05/2019 Document Reviewed: 06/05/2019 Elsevier Patient Education  [place] Anesthesia, Adult, Care After This sheet gives you information about how to care for yourself after your procedure. Your health care provider may also give you more specific instructions. If you have problems or questions, contact your health careprovider. What can I expect after the procedure? After the procedure, the following side effects are common: Pain or discomfort at the IV site. Nausea. Vomiting. Sore throat. Trouble concentrating. Feeling cold or chills. Feeling weak or tired. Sleepiness and  fatigue. Soreness and body aches. These side effects can affect parts of the body that were not involved in surgery. Follow these instructions at home: For the time period you were told by your health care provider:  Rest. Do not participate in activities where you could fall or become injured. Do not drive or use machinery. Do not drink alcohol. Do not take sleeping pills or medicines that cause drowsiness. Do not make important decisions or sign legal documents. Do not take care of children on your own.  Eating and drinking Follow any instructions from your health care provider about eating or drinking restrictions. When you feel hungry, start by eating small amounts of foods that are soft and easy to digest (bland), such as toast. Gradually return to your regular diet. Drink enough fluid to keep your urine pale yellow. If you vomit, rehydrate by drinking water, juice, or clear broth. General instructions If you have sleep apnea, surgery and certain medicines can increase your risk for breathing problems. Follow instructions from your health care provider about wearing your sleep device: Anytime you are sleeping, including during daytime naps. While taking prescription pain medicines, sleeping medicines, or medicines that make you drowsy. Have a responsible adult stay with you for the time you are told. It is important to have someone help care for you until you are awake and alert. Return to your normal activities as told by your health care provider. Ask your health care provider what activities are safe for you. Take over-the-counter and prescription medicines only as told by your health care provider. If you smoke, do not smoke without supervision. Keep all follow-up visits as told by your health care provider. This is important. Contact a health care provider if: You have nausea or vomiting that does not get better with medicine. You cannot eat or drink without vomiting. You have  pain that does not get better with medicine. You are unable to pass urine. You develop a skin rash. You have a fever. You have redness around your IV site that gets worse. Get help right away if: You have difficulty breathing. You have chest pain. You have blood in your urine or stool, or you vomit blood. Summary After the procedure, it is common to have a sore throat or nausea. It is also common to feel tired. Have a responsible adult stay with you for the time you are told. It is important to have someone help care for you until you are  awake and alert. When you feel hungry, start by eating small amounts of foods that are soft and easy to digest (bland), such as toast. Gradually return to your regular diet. Drink enough fluid to keep your urine pale yellow. Return to your normal activities as told by your health care provider. Ask your health care provider what activities are safe for you. This information is not intended to replace advice given to you by your health care provider. Make sure you discuss any questions you have with your healthcare provider. Document Revised: 08/15/2020 Document Reviewed: 03/14/2020 Elsevier Patient Education  2022 [place]. How to Use Chlorhexidine for Bathing Chlorhexidine gluconate (CHG) is a germ-killing (antiseptic) solution that is used to clean the skin. It can get rid of the bacteria that normally live on the skin and can keep them away for about 24 hours. To clean your skin with CHG, you may be given: A CHG solution to use in the shower or as part of a sponge bath. A prepackaged cloth that contains CHG. Cleaning your skin with CHG may help lower the risk for infection: While you are staying in the intensive care unit of the hospital. If you have a vascular access, such as a central line, to provide short-term or long-term access to your veins. If you have a catheter to drain urine from your bladder. If you are on a ventilator. A ventilator is  a machine that helps you breathe by moving air in and out of your lungs. After surgery. What are the risks? Risks of using CHG include: A skin reaction. Hearing loss, if CHG gets in your ears. Eye injury, if CHG gets in your eyes and is not rinsed out. The CHG product catching fire. Make sure that you avoid smoking and flames after applying CHG to your skin. Do not use CHG: If you have a chlorhexidine allergy or have previously reacted to chlorhexidine. On babies younger than 6 months of age. How to use CHG solution Use CHG only as told by your health care provider, and follow the instructions on the label. Use the full amount of CHG as directed. Usually, this is one bottle. During a shower Follow these steps when using CHG solution during a shower (unless your health care provider gives you different instructions): Start the shower. Use your normal soap and shampoo to wash your face and hair. Turn off the shower or move out of the shower stream. Pour the CHG onto a clean washcloth. Do not use any type of brush or rough-edged sponge. Starting at your neck, lather your body down to your toes. Make sure you follow these instructions: If you will be having surgery, pay special attention to the part of your body where you will be having surgery. Scrub this area for at least 1 minute. Do not use CHG on your head or face. If the solution gets into your ears or eyes, rinse them well with water. Avoid your genital area. Avoid any areas of skin that have broken skin, cuts, or scrapes. Scrub your back and under your arms. Make sure to wash skin folds. Let the lather sit on your skin for 1-2 minutes or as long as told by your health care provider. Thoroughly rinse your entire body in the shower. Make sure that all body creases and crevices are rinsed well. Dry off with a clean towel. Do not put any substances on your body afterward--such as powder, lotion, or perfume--unless you are told to do so  by your health care provider. Only use lotions that are recommended by the manufacturer. Put on clean clothes or pajamas. If it is the night before your surgery, sleep in clean sheets.  During a sponge bath Follow these steps when using CHG solution during a sponge bath (unless your health care provider gives you different instructions): Use your normal soap and shampoo to wash your face and hair. Pour the CHG onto a clean washcloth. Starting at your neck, lather your body down to your toes. Make sure you follow these instructions: If you will be having surgery, pay special attention to the part of your body where you will be having surgery. Scrub this area for at least 1 minute. Do not use CHG on your head or face. If the solution gets into your ears or eyes, rinse them well with water. Avoid your genital area. Avoid any areas of skin that have broken skin, cuts, or scrapes. Scrub your back and under your arms. Make sure to wash skin folds. Let the lather sit on your skin for 1-2 minutes or as long as told by your health care provider. Using a different clean, wet washcloth, thoroughly rinse your entire body. Make sure that all body creases and crevices are rinsed well. Dry off with a clean towel. Do not put any substances on your body afterward--such as powder, lotion, or perfume--unless you are told to do so by your health care provider. Only use lotions that are recommended by the manufacturer. Put on clean clothes or pajamas. If it is the night before your surgery, sleep in clean sheets. How to use CHG prepackaged cloths Only use CHG cloths as told by your health care provider, and follow the instructions on the label. Use the CHG cloth on clean, dry skin. Do not use the CHG cloth on your head or face unless your health care provider tells you to. When washing with the CHG cloth: Avoid your genital area. Avoid any areas of skin that have broken skin, cuts, or scrapes. Before  surgery Follow these steps when using a CHG cloth to clean before surgery (unless your health care provider gives you different instructions): Using the CHG cloth, vigorously scrub the part of your body where you will be having surgery. Scrub using a back-and-forth motion for 3 minutes. The area on your body should be completely wet with CHG when you are done scrubbing. Do not rinse. Discard the cloth and let the area air-dry. Do not put any substances on the area afterward, such as powder, lotion, or perfume. Put on clean clothes or pajamas. If it is the night before your surgery, sleep in clean sheets.  For general bathing Follow these steps when using CHG cloths for general bathing (unless your health care provider gives you different instructions). Use a separate CHG cloth for each area of your body. Make sure you wash between any folds of skin and between your fingers and toes. Wash your body in the following order, switching to a new cloth after each step: The front of your neck, shoulders, and chest. Both of your arms, under your arms, and your hands. Your stomach and groin area, avoiding the genitals. Your right leg and foot. Your left leg and foot. The back of your neck, your back, and your buttocks. Do not rinse. Discard the cloth and let the area air-dry. Do not put any substances on your body afterward--such as powder, lotion, or perfume--unless you are told to do so by your health  care provider. Only use lotions that are recommended by the manufacturer. Put on clean clothes or pajamas. Contact a health care provider if: Your skin gets irritated after scrubbing. You have questions about using your solution or cloth. Get help right away if: Your eyes become very red or swollen. Your eyes itch badly. Your skin itches badly and is red or swollen. Your hearing changes. You have trouble seeing. You have swelling or tingling in your mouth or throat. You have trouble breathing. You  swallow any chlorhexidine. Summary Chlorhexidine gluconate (CHG) is a germ-killing (antiseptic) solution that is used to clean the skin. Cleaning your skin with CHG may help to lower your risk for infection. You may be given CHG to use for bathing. It may be in a bottle or in a prepackaged cloth to use on your skin. Carefully follow your health care provider's instructions and the instructions on the product label. Do not use CHG if you have a chlorhexidine allergy. Contact your health care provider if your skin gets irritated after scrubbing. This information is not intended to replace advice given to you by your health care provider. Make sure you discuss any questions you have with your healthcare provider. Document Revised: 04/12/2020 Document Reviewed: 05/17/2020 Elsevier Patient Education  [place].

## 2021-07-25 NOTE — H&P (Signed)
[name]; [id number]; 05/19/1946   HPI Patient is [name] who was referred to my care by [name] for evaluation and treatment of Erica right breast cancer.  This was found recently on routine mammography.  Biopsies revealed an infiltrating ductal carcinoma, ER/PR positive.  Patient denies any history of breast cancer.  She does have Erica sister who she thinks had breast cancer.  No nipple discharge has been noted.  She does not feel Erica mass. Past Medical History:  Diagnosis Date   Acid reflux    HTN (hypertension)    Hypothyroidism    Sleep apnea    Stop Bang Cock score of 4   Thyroid disease     Past Surgical History:  Procedure Laterality Date   BLADDER SURGERY     CHOLECYSTECTOMY     COLONOSCOPY N/Erica 12/27/2013   Procedure: COLONOSCOPY;  Surgeon: Rogene Houston, MD;  Location: [facility];  Service: Endoscopy;  Laterality: N/Erica;  1030   ESOPHAGOGASTRODUODENOSCOPY (EGD) WITH ESOPHAGEAL DILATION N/Erica 04/20/2013   Procedure: ESOPHAGOGASTRODUODENOSCOPY (EGD) WITH ESOPHAGEAL DILATION;  Surgeon: Rogene Houston, MD;  Location: [facility];  Service: Endoscopy;  Laterality: N/Erica;  300-moved to 100 Ann to notify pt   PARTIAL HYSTERECTOMY     TUBAL LIGATION     UMBILICAL HERNIA REPAIR      Family History  Problem Relation Age of Onset   Heart disease Mother    Emphysema Father    Breast cancer Sister    Heart disease Brother    Heart disease Brother    Heart disease Sister     Current Outpatient Medications on File Prior to Visit  Medication Sig Dispense Refill   ALPRAZolam (XANAX) 0.25 MG tablet Take 0.25 mg by mouth at bedtime as needed. For sleep     aspirin EC 81 MG tablet Take 81 mg by mouth daily.     B Complex Vitamins (B-COMPLEX/B-12 SL) Place 1 tablet under the tongue every morning.      BIOTIN PO Take 2,500 mg by mouth.      levothyroxine (SYNTHROID, LEVOTHROID) 75 MCG tablet Take 75 mcg by mouth daily before breakfast.      lisinopril-hydrochlorothiazide  (PRINZIDE,ZESTORETIC) 20-12.5 MG per tablet Take 1 tablet by mouth every morning.      lovastatin (MEVACOR) 20 MG tablet Take 20 mg by mouth at bedtime.     Magnesium 250 MG TABS Take 1 tablet by mouth every morning.     omeprazole (PRILOSEC) 20 MG capsule Take 20 mg by mouth every morning.      No current facility-administered medications on file prior to visit.    Allergies  Allergen Reactions   Ancef [Cefazolin] Anaphylaxis   Ciprofloxacin Anaphylaxis    Social History   Substance and Sexual Activity  Alcohol Use No    Social History   Tobacco Use  Smoking Status Never  Smokeless Tobacco Never    Review of Systems  Constitutional: Negative.   Eyes: Negative.   Respiratory: Negative.    Cardiovascular: Negative.   Gastrointestinal: Negative.   Genitourinary: Negative.   Musculoskeletal:  Positive for joint pain.  Skin: Negative.   Neurological: Negative.   Endo/Heme/Allergies: Negative.   Psychiatric/Behavioral: Negative.     Objective   Vitals:   07/24/21 1001  BP: (!) 143/81  Pulse: 88  Resp: 16  Temp: 97.9 F (36.6 C)  SpO2: 96%    Physical Exam Vitals reviewed. Exam conducted with Erica chaperone present.  Constitutional:      Appearance: Normal appearance. She is not ill-appearing.  HENT:     Head: Normocephalic and atraumatic.  Cardiovascular:     Rate and Rhythm: Normal rate and regular rhythm.     Heart sounds: Normal heart sounds. No murmur heard.   No friction rub. No gallop.  Pulmonary:     Effort: Pulmonary effort is normal. No respiratory distress.     Breath sounds: Normal breath sounds. No stridor. No wheezing, rhonchi or rales.  Skin:    General: Skin is warm and dry.  Neurological:     Mental Status: She is alert and oriented to person, place, and time.  Breast: No dominant mass, nipple discharge, or dimpling noted in either breast.  Axilla is negative for palpable nodes.  Mammogram, ultrasound, and pathology reports are reviewed.   Size of lesion appears to be 8.8 cm.  No axillary lymphadenopathy noted.  Assessment  Infiltrating ductal carcinoma of right breast, 0.8 cm, ER/PR positive Plan  We will proceed with right partial mastectomy after radiofrequency tag placement on 08/04/2021.  No need for sentinel lymph node biopsy given negative ultrasound and patient's age.  This was discussed with Dr. Delton Coombes.  The risks and benefits of the procedure including bleeding and infection were fully explained to the patient, who gave informed consent.

## 2021-07-25 NOTE — Progress Notes (Signed)
[name]; [id number]; May 04, 1946   HPI Patient is a 75 year old white female who was referred to my care by [name] for evaluation and treatment of a right breast cancer.  This was found recently on routine mammography.  Biopsies revealed an infiltrating ductal carcinoma, ER/PR positive.  Patient denies any history of breast cancer.  She does have a sister who she thinks had breast cancer.  No nipple discharge has been noted.  She does not feel a mass. Past Medical History:  Diagnosis Date   Acid reflux    HTN (hypertension)    Hypothyroidism    Sleep apnea    Stop Bang Cock score of 4   Thyroid disease     Past Surgical History:  Procedure Laterality Date   BLADDER SURGERY     CHOLECYSTECTOMY     COLONOSCOPY N/A 12/27/2013   Procedure: COLONOSCOPY;  Surgeon: Rogene Houston, MD;  Location: [facility];  Service: Endoscopy;  Laterality: N/A;  1030   ESOPHAGOGASTRODUODENOSCOPY (EGD) WITH ESOPHAGEAL DILATION N/A 04/20/2013   Procedure: ESOPHAGOGASTRODUODENOSCOPY (EGD) WITH ESOPHAGEAL DILATION;  Surgeon: Rogene Houston, MD;  Location: [facility];  Service: Endoscopy;  Laterality: N/A;  300-moved to 100 Ann to notify pt   PARTIAL HYSTERECTOMY     TUBAL LIGATION     UMBILICAL HERNIA REPAIR      Family History  Problem Relation Age of Onset   Heart disease Mother    Emphysema Father    Breast cancer Sister    Heart disease Brother    Heart disease Brother    Heart disease Sister     Current Outpatient Medications on File Prior to Visit  Medication Sig Dispense Refill   ALPRAZolam (XANAX) 0.25 MG tablet Take 0.25 mg by mouth at bedtime as needed. For sleep     aspirin EC 81 MG tablet Take 81 mg by mouth daily.     B Complex Vitamins (B-COMPLEX/B-12 SL) Place 1 tablet under the tongue every morning.      BIOTIN PO Take 2,500 mg by mouth.      levothyroxine (SYNTHROID, LEVOTHROID) 75 MCG tablet Take 75 mcg by mouth daily before breakfast.      lisinopril-hydrochlorothiazide  (PRINZIDE,ZESTORETIC) 20-12.5 MG per tablet Take 1 tablet by mouth every morning.      lovastatin (MEVACOR) 20 MG tablet Take 20 mg by mouth at bedtime.     Magnesium 250 MG TABS Take 1 tablet by mouth every morning.     omeprazole (PRILOSEC) 20 MG capsule Take 20 mg by mouth every morning.      No current facility-administered medications on file prior to visit.    Allergies  Allergen Reactions   Ancef [Cefazolin] Anaphylaxis   Ciprofloxacin Anaphylaxis    Social History   Substance and Sexual Activity  Alcohol Use No    Social History   Tobacco Use  Smoking Status Never  Smokeless Tobacco Never    Review of Systems  Constitutional: Negative.   Eyes: Negative.   Respiratory: Negative.    Cardiovascular: Negative.   Gastrointestinal: Negative.   Genitourinary: Negative.   Musculoskeletal:  Positive for joint pain.  Skin: Negative.   Neurological: Negative.   Endo/Heme/Allergies: Negative.   Psychiatric/Behavioral: Negative.     Objective   Vitals:   07/24/21 1001  BP: (!) 143/81  Pulse: 88  Resp: 16  Temp: 97.9 F (36.6 C)  SpO2: 96%    Physical Exam Vitals reviewed. Exam conducted with a chaperone present.  Constitutional:      Appearance: Normal appearance. She is not ill-appearing.  HENT:     Head: Normocephalic and atraumatic.  Cardiovascular:     Rate and Rhythm: Normal rate and regular rhythm.     Heart sounds: Normal heart sounds. No murmur heard.   No friction rub. No gallop.  Pulmonary:     Effort: Pulmonary effort is normal. No respiratory distress.     Breath sounds: Normal breath sounds. No stridor. No wheezing, rhonchi or rales.  Skin:    General: Skin is warm and dry.  Neurological:     Mental Status: She is alert and oriented to person, place, and time.  Breast: No dominant mass, nipple discharge, or dimpling noted in either breast.  Axilla is negative for palpable nodes.  Mammogram, ultrasound, and pathology reports are reviewed.   Size of lesion appears to be 8.8 cm.  No axillary lymphadenopathy noted.  Assessment  Infiltrating ductal carcinoma of right breast, 0.8 cm, ER/PR positive Plan  We will proceed with right partial mastectomy after radiofrequency tag placement on 08/04/2021.  No need for sentinel lymph node biopsy given negative ultrasound and patient's age.  This was discussed with Dr. Delton Coombes.  The risks and benefits of the procedure including bleeding and infection were fully explained to the patient, who gave informed consent.

## 2021-07-29 ENCOUNTER — Ambulatory Visit (HOSPITAL_COMMUNITY)
Admission: RE | Admit: 2021-07-29 | Discharge: 2021-07-29 | Disposition: A | Discharge status: Home / Self Care | Payer: Medicare Other | Source: Ambulatory Visit | Attending: General Surgery | Admitting: General Surgery

## 2021-07-29 ENCOUNTER — Other Ambulatory Visit (HOSPITAL_COMMUNITY): Payer: Self-pay | Admitting: Internal Medicine

## 2021-07-29 ENCOUNTER — Other Ambulatory Visit: Payer: Self-pay

## 2021-07-29 ENCOUNTER — Encounter (HOSPITAL_COMMUNITY): Payer: Self-pay

## 2021-07-29 ENCOUNTER — Ambulatory Visit (HOSPITAL_COMMUNITY)
Admission: RE | Admit: 2021-07-29 | Discharge: 2021-07-29 | Disposition: A | Discharge status: Home / Self Care | Payer: Medicare Other | Source: Ambulatory Visit | Attending: Internal Medicine | Admitting: Internal Medicine

## 2021-07-29 DIAGNOSIS — R928 Other abnormal and inconclusive findings on diagnostic imaging of breast: Secondary | ICD-10-CM

## 2021-07-29 DIAGNOSIS — C50311 Malignant neoplasm of lower-inner quadrant of right female breast: Secondary | ICD-10-CM | POA: Diagnosis not present

## 2021-07-29 HISTORY — DX: Malignant neoplasm of unspecified site of right female breast: C50.911

## 2021-07-29 MED ORDER — LIDOCAINE-EPINEPHRINE (PF) 2 %-1:200000 IJ SOLN
INTRAMUSCULAR | Status: AC
  Administered 2021-07-29: 10 mL
  Filled 2021-07-29: qty 10

## 2021-07-29 NOTE — Sedation Documentation (Signed)
PT tolerated right breast clip placement well today with NAD noted. PT verbalized understanding of discharge instructions and given ice packs. PT ambulated back to the mammogram area this time.

## 2021-07-31 ENCOUNTER — Encounter (HOSPITAL_COMMUNITY)
Admission: RE | Admit: 2021-07-31 | Discharge: 2021-07-31 | Disposition: A | Discharge status: Home / Self Care | Payer: Medicare Other | Source: Ambulatory Visit | Attending: General Surgery | Admitting: General Surgery

## 2021-07-31 ENCOUNTER — Other Ambulatory Visit: Payer: Self-pay

## 2021-07-31 ENCOUNTER — Encounter (HOSPITAL_COMMUNITY): Payer: Self-pay

## 2021-07-31 ENCOUNTER — Ambulatory Visit (HOSPITAL_COMMUNITY)
Admission: RE | Admit: 2021-07-31 | Discharge: 2021-07-31 | Disposition: A | Discharge status: Home / Self Care | Payer: Medicare Other | Source: Ambulatory Visit | Attending: General Surgery | Admitting: General Surgery

## 2021-07-31 DIAGNOSIS — C50911 Malignant neoplasm of unspecified site of right female breast: Secondary | ICD-10-CM | POA: Diagnosis not present

## 2021-07-31 DIAGNOSIS — Z01818 Encounter for other preprocedural examination: Secondary | ICD-10-CM | POA: Diagnosis not present

## 2021-07-31 LAB — COMPREHENSIVE METABOLIC PANEL
ALT: 21 U/L (ref 0–44)
AST: 20 U/L (ref 15–41)
Albumin: 4.1 g/dL (ref 3.5–5.0)
Alkaline Phosphatase: 81 U/L (ref 38–126)
Anion gap: 10 (ref 5–15)
BUN: 21 mg/dL (ref 8–23)
CO2: 25 mmol/L (ref 22–32)
Calcium: 9.3 mg/dL (ref 8.9–10.3)
Chloride: 104 mmol/L (ref 98–111)
Creatinine, Ser: 0.79 mg/dL (ref 0.44–1.00)
GFR, Estimated: >60 mL/min (ref >60)
Glucose, Bld: 90 mg/dL (ref 70–99)
Potassium: 3.5 mmol/L (ref 3.5–5.1)
Sodium: 139 mmol/L (ref 135–145)
Total Bilirubin: 0.7 mg/dL (ref 0.3–1.2)
Total Protein: 7.1 g/dL (ref 6.5–8.1)

## 2021-07-31 LAB — CBC WITH DIFFERENTIAL/PLATELET
Abs Immature Granulocytes: 0.03 10*3/uL (ref 0.00–0.07)
Basophils Absolute: 0.1 10*3/uL (ref 0.0–0.1)
Basophils Relative: 1 %
Eosinophils Absolute: 0.2 10*3/uL (ref 0.0–0.5)
Eosinophils Relative: 2 %
HCT: 43.8 % (ref 36.0–46.0)
Hemoglobin: 14.3 g/dL (ref 12.0–15.0)
Immature Granulocytes: 0 %
Lymphocytes Relative: 26 %
Lymphs Abs: 2.6 10*3/uL (ref 0.7–4.0)
MCH: 29 pg (ref 26.0–34.0)
MCHC: 32.6 g/dL (ref 30.0–36.0)
MCV: 88.8 fL (ref 80.0–100.0)
Monocytes Absolute: 0.6 10*3/uL (ref 0.1–1.0)
Monocytes Relative: 6 %
Neutro Abs: 6.7 10*3/uL (ref 1.7–7.7)
Neutrophils Relative %: 65 %
Platelets: 285 10*3/uL (ref 150–400)
RBC: 4.93 MIL/uL (ref 3.87–5.11)
RDW: 14.4 % (ref 11.5–15.5)
WBC: 10.2 10*3/uL (ref 4.0–10.5)
nRBC: 0 % (ref 0.0–0.2)

## 2021-07-31 NOTE — Pre-Procedure Instructions (Signed)
In for PAT. Patient is tearful as she talks about her diagnosis. "I am just as worried about my husband. In the middle of finding out I have cancer, he had an emergency heart catherization and stents put in last week. I tried to tell him he needs to wait at home but he is so worried about me he dosent want to. He doesn't need to ne by himself yet though. Is there any way one of my children can wait with hi? He is weak and can't hear well. I am as worried about him as he is about me." I spoke with Abbie Sons, RN coordinator and we will put note on patient's chart stating this is okay as long as they wear their masks. Patient again became very tearful and appreciative of this.

## 2021-08-04 ENCOUNTER — Encounter (HOSPITAL_COMMUNITY)
Admission: RE | Disposition: A | Discharge status: Home / Self Care | Payer: Self-pay | Source: Home / Self Care | Attending: General Surgery

## 2021-08-04 ENCOUNTER — Ambulatory Visit (HOSPITAL_COMMUNITY): Payer: Medicare Other | Admitting: Anesthesiology

## 2021-08-04 ENCOUNTER — Ambulatory Visit (HOSPITAL_COMMUNITY)
Admission: RE | Admit: 2021-08-04 | Discharge: 2021-08-04 | Disposition: A | Discharge status: Home / Self Care | Payer: Medicare Other | Attending: General Surgery | Admitting: General Surgery

## 2021-08-04 ENCOUNTER — Ambulatory Visit (HOSPITAL_COMMUNITY): Payer: Medicare Other

## 2021-08-04 ENCOUNTER — Encounter (HOSPITAL_COMMUNITY): Payer: Self-pay | Admitting: General Surgery

## 2021-08-04 DIAGNOSIS — Z881 Allergy status to other antibiotic agents status: Secondary | ICD-10-CM | POA: Insufficient documentation

## 2021-08-04 DIAGNOSIS — K219 Gastro-esophageal reflux disease without esophagitis: Secondary | ICD-10-CM | POA: Diagnosis not present

## 2021-08-04 DIAGNOSIS — E039 Hypothyroidism, unspecified: Secondary | ICD-10-CM | POA: Insufficient documentation

## 2021-08-04 DIAGNOSIS — G4730 Sleep apnea, unspecified: Secondary | ICD-10-CM | POA: Diagnosis not present

## 2021-08-04 DIAGNOSIS — I10 Essential (primary) hypertension: Secondary | ICD-10-CM | POA: Insufficient documentation

## 2021-08-04 DIAGNOSIS — R928 Other abnormal and inconclusive findings on diagnostic imaging of breast: Secondary | ICD-10-CM

## 2021-08-04 DIAGNOSIS — C50311 Malignant neoplasm of lower-inner quadrant of right female breast: Secondary | ICD-10-CM | POA: Diagnosis not present

## 2021-08-04 DIAGNOSIS — Z803 Family history of malignant neoplasm of breast: Secondary | ICD-10-CM | POA: Insufficient documentation

## 2021-08-04 DIAGNOSIS — Z8249 Family history of ischemic heart disease and other diseases of the circulatory system: Secondary | ICD-10-CM | POA: Insufficient documentation

## 2021-08-04 DIAGNOSIS — C50911 Malignant neoplasm of unspecified site of right female breast: Secondary | ICD-10-CM | POA: Insufficient documentation

## 2021-08-04 DIAGNOSIS — Z90711 Acquired absence of uterus with remaining cervical stump: Secondary | ICD-10-CM | POA: Insufficient documentation

## 2021-08-04 DIAGNOSIS — Z79899 Other long term (current) drug therapy: Secondary | ICD-10-CM | POA: Insufficient documentation

## 2021-08-04 DIAGNOSIS — Z79890 Hormone replacement therapy (postmenopausal): Secondary | ICD-10-CM | POA: Insufficient documentation

## 2021-08-04 DIAGNOSIS — Z7982 Long term (current) use of aspirin: Secondary | ICD-10-CM | POA: Insufficient documentation

## 2021-08-04 DIAGNOSIS — Z9049 Acquired absence of other specified parts of digestive tract: Secondary | ICD-10-CM | POA: Insufficient documentation

## 2021-08-04 DIAGNOSIS — Z170 Estrogen receptor positive status [ER+]: Secondary | ICD-10-CM | POA: Insufficient documentation

## 2021-08-04 HISTORY — PX: BREAST LUMPECTOMY WITH RADIOFREQUENCY TAG IDENTIFICATION: SHX6884

## 2021-08-04 HISTORY — PX: MASTECTOMY, PARTIAL: SHX709

## 2021-08-04 SURGERY — MASTECTOMY PARTIAL
Anesthesia: General | Laterality: Right

## 2021-08-04 MED ORDER — CHLORHEXIDINE GLUCONATE 0.12 % MT SOLN
15.0000 mL | Freq: Once | OROMUCOSAL | Status: AC
  Administered 2021-08-04: 15 mL via OROMUCOSAL

## 2021-08-04 MED ORDER — DIPHENHYDRAMINE HCL 50 MG/ML IJ SOLN
INTRAMUSCULAR | Status: AC
  Filled 2021-08-04: qty 1

## 2021-08-04 MED ORDER — LACTATED RINGERS IV SOLN: INTRAVENOUS | Status: DC

## 2021-08-04 MED ORDER — LIDOCAINE 2% (20 MG/ML) 5 ML SYRINGE
INTRAMUSCULAR | Status: DC | PRN
  Administered 2021-08-04: 100 mg via INTRAVENOUS

## 2021-08-04 MED ORDER — KETOROLAC TROMETHAMINE 30 MG/ML IJ SOLN
INTRAMUSCULAR | Status: AC
  Filled 2021-08-04: qty 1

## 2021-08-04 MED ORDER — ONDANSETRON HCL 4 MG/2ML IJ SOLN
INTRAMUSCULAR | Status: AC
  Filled 2021-08-04: qty 2

## 2021-08-04 MED ORDER — GLYCOPYRROLATE PF 0.2 MG/ML IJ SOSY
PREFILLED_SYRINGE | INTRAMUSCULAR | Status: AC
  Filled 2021-08-04: qty 1

## 2021-08-04 MED ORDER — DEXAMETHASONE SODIUM PHOSPHATE 10 MG/ML IJ SOLN
INTRAMUSCULAR | Status: AC
  Filled 2021-08-04: qty 1

## 2021-08-04 MED ORDER — PROPOFOL 10 MG/ML IV BOLUS
INTRAVENOUS | Status: DC | PRN
  Administered 2021-08-04: 50 mg via INTRAVENOUS
  Administered 2021-08-04: 150 mg via INTRAVENOUS

## 2021-08-04 MED ORDER — BUPIVACAINE HCL (PF) 0.5 % IJ SOLN
INTRAMUSCULAR | Status: AC
  Filled 2021-08-04: qty 30

## 2021-08-04 MED ORDER — KETOROLAC TROMETHAMINE 30 MG/ML IJ SOLN
INTRAMUSCULAR | Status: DC | PRN
  Administered 2021-08-04: 30 mg via INTRAVENOUS

## 2021-08-04 MED ORDER — CHLORHEXIDINE GLUCONATE CLOTH 2 % EX PADS: 6.0000 | MEDICATED_PAD | Freq: Once | CUTANEOUS | Status: DC

## 2021-08-04 MED ORDER — PHENYLEPHRINE 40 MCG/ML (10ML) SYRINGE FOR IV PUSH (FOR BLOOD PRESSURE SUPPORT)
PREFILLED_SYRINGE | INTRAVENOUS | Status: DC | PRN
  Administered 2021-08-04 (×6): 80 ug via INTRAVENOUS

## 2021-08-04 MED ORDER — VANCOMYCIN HCL IN DEXTROSE 1-5 GM/200ML-% IV SOLN
INTRAVENOUS | Status: AC
  Filled 2021-08-04: qty 200

## 2021-08-04 MED ORDER — PHENYLEPHRINE HCL-NACL 20-0.9 MG/250ML-% IV SOLN
INTRAVENOUS | Status: AC
  Filled 2021-08-04: qty 250

## 2021-08-04 MED ORDER — GLYCOPYRROLATE PF 0.2 MG/ML IJ SOSY
PREFILLED_SYRINGE | INTRAMUSCULAR | Status: DC | PRN
  Administered 2021-08-04 (×2): .1 mg via INTRAVENOUS

## 2021-08-04 MED ORDER — DEXAMETHASONE SODIUM PHOSPHATE 4 MG/ML IJ SOLN
INTRAMUSCULAR | Status: DC | PRN
  Administered 2021-08-04: 4 mg via INTRAVENOUS

## 2021-08-04 MED ORDER — PROPOFOL 10 MG/ML IV BOLUS
INTRAVENOUS | Status: AC
  Filled 2021-08-04: qty 40

## 2021-08-04 MED ORDER — FENTANYL CITRATE (PF) 100 MCG/2ML IJ SOLN
INTRAMUSCULAR | Status: AC
  Filled 2021-08-04: qty 2

## 2021-08-04 MED ORDER — PHENYLEPHRINE 40 MCG/ML (10ML) SYRINGE FOR IV PUSH (FOR BLOOD PRESSURE SUPPORT)
PREFILLED_SYRINGE | INTRAVENOUS | Status: AC
  Filled 2021-08-04: qty 10

## 2021-08-04 MED ORDER — VANCOMYCIN HCL IN DEXTROSE 1-5 GM/200ML-% IV SOLN
1000.0000 mg | INTRAVENOUS | Status: AC
  Administered 2021-08-04: 1000 mg via INTRAVENOUS

## 2021-08-04 MED ORDER — FENTANYL CITRATE (PF) 100 MCG/2ML IJ SOLN
INTRAMUSCULAR | Status: DC | PRN
  Administered 2021-08-04 (×2): 50 ug via INTRAVENOUS

## 2021-08-04 MED ORDER — FENTANYL CITRATE PF 50 MCG/ML IJ SOSY: 25.0000 ug | PREFILLED_SYRINGE | INTRAMUSCULAR | Status: DC | PRN

## 2021-08-04 MED ORDER — ORAL CARE MOUTH RINSE: 15.0000 mL | Freq: Once | OROMUCOSAL | Status: AC

## 2021-08-04 MED ORDER — BUPIVACAINE LIPOSOME 1.3 % IJ SUSP
INTRAMUSCULAR | Status: DC | PRN
  Administered 2021-08-04: 10 mL

## 2021-08-04 MED ORDER — TRAMADOL HCL 50 MG PO TABS: 50.0000 mg | ORAL_TABLET | Freq: Two times a day (BID) | ORAL | 0 refills | Status: DC | PRN

## 2021-08-04 MED ORDER — CHLORHEXIDINE GLUCONATE 0.12 % MT SOLN
OROMUCOSAL | Status: AC
  Filled 2021-08-04: qty 15

## 2021-08-04 MED ORDER — ONDANSETRON HCL 4 MG/2ML IJ SOLN
INTRAMUSCULAR | Status: DC | PRN
  Administered 2021-08-04: 4 mg via INTRAVENOUS

## 2021-08-04 MED ORDER — BUPIVACAINE LIPOSOME 1.3 % IJ SUSP
INTRAMUSCULAR | Status: AC
  Filled 2021-08-04: qty 20

## 2021-08-04 MED ORDER — 0.9 % SODIUM CHLORIDE (POUR BTL) OPTIME
TOPICAL | Status: DC | PRN
  Administered 2021-08-04: 1000 mL

## 2021-08-04 MED ORDER — LIDOCAINE HCL (PF) 2 % IJ SOLN
INTRAMUSCULAR | Status: AC
  Filled 2021-08-04: qty 5

## 2021-08-04 MED ORDER — DIPHENHYDRAMINE HCL 50 MG/ML IJ SOLN
25.0000 mg | INTRAMUSCULAR | Status: DC | PRN
  Administered 2021-08-04: 25 mg via INTRAVENOUS

## 2021-08-04 MED ORDER — ONDANSETRON HCL 4 MG/2ML IJ SOLN: 4.0000 mg | Freq: Once | INTRAMUSCULAR | Status: DC | PRN

## 2021-08-04 SURGICAL SUPPLY — 31 items
ADH SKN CLS APL DERMABOND .7 (GAUZE/BANDAGES/DRESSINGS) ×1
APL PRP STRL LF DISP 70% ISPRP (MISCELLANEOUS) ×1
BLADE SURG 15 STRL LF DISP TIS (BLADE) ×1 IMPLANT
BLADE SURG 15 STRL SS (BLADE) ×2
CHLORAPREP W/TINT 26 (MISCELLANEOUS) ×2 IMPLANT
CLOTH BEACON ORANGE TIMEOUT ST (SAFETY) ×2 IMPLANT
COVER LIGHT HANDLE STERIS (MISCELLANEOUS) ×4 IMPLANT
DECANTER SPIKE VIAL GLASS SM (MISCELLANEOUS) IMPLANT
DERMABOND ADVANCED (GAUZE/BANDAGES/DRESSINGS) ×1
DERMABOND ADVANCED .7 DNX12 (GAUZE/BANDAGES/DRESSINGS) ×1 IMPLANT
ELECT REM PT RETURN 9FT ADLT (ELECTROSURGICAL) ×2
ELECTRODE REM PT RTRN 9FT ADLT (ELECTROSURGICAL) ×1 IMPLANT
GLOVE SURG POLYISO LF SZ7.5 (GLOVE) ×4 IMPLANT
GLOVE SURG UNDER POLY LF SZ7 (GLOVE) ×4 IMPLANT
GLOVE SURG UNDER POLY LF SZ7.5 (GLOVE) ×2 IMPLANT
GOWN STRL REUS W/TWL LRG LVL3 (GOWN DISPOSABLE) ×4 IMPLANT
KIT TURNOVER KIT A (KITS) ×2 IMPLANT
MANIFOLD NEPTUNE II (INSTRUMENTS) ×2 IMPLANT
NEEDLE HYPO 25X1 1.5 SAFETY (NEEDLE) ×2 IMPLANT
NS IRRIG 1000ML POUR BTL (IV SOLUTION) ×2 IMPLANT
PACK MINOR (CUSTOM PROCEDURE TRAY) ×2 IMPLANT
PAD ARMBOARD 7.5X6 YLW CONV (MISCELLANEOUS) ×2 IMPLANT
PENCIL SMOKE EVACUATOR (MISCELLANEOUS) ×2 IMPLANT
SET BASIN LINEN APH (SET/KITS/TRAYS/PACK) ×2 IMPLANT
SET LOCALIZER 20 PROBE US (MISCELLANEOUS) ×2 IMPLANT
SPONGE T-LAP 18X18 ~~LOC~~+RFID (SPONGE) ×2 IMPLANT
SUT MNCRL AB 4-0 PS2 18 (SUTURE) ×2 IMPLANT
SUT SILK 2 0 SH (SUTURE) ×2 IMPLANT
SUT VIC AB 3-0 SH 27 (SUTURE) ×2
SUT VIC AB 3-0 SH 27X BRD (SUTURE) ×1 IMPLANT
SYR CONTROL 10ML LL (SYRINGE) ×2 IMPLANT

## 2021-08-04 NOTE — Transfer of Care (Signed)
Immediate Anesthesia Transfer of Care Note  Patient: [name]  Procedure(s) Performed: MASTECTOMY PARTIAL (Right) BREAST LUMPECTOMY WITH RADIOFREQUENCY TAG IDENTIFICATION (Right)  Patient Location: PACU  Anesthesia Type:General  Level of Consciousness: drowsy  Airway & Oxygen Therapy: Patient Spontanous Breathing and Patient connected to face mask oxygen  Post-op Assessment: Report given to RN and Post -op Vital signs reviewed and stable  Post vital signs: Reviewed and stable  Last Vitals:  Vitals Value Taken Time  BP    Temp    Pulse 80 08/04/21 0935  Resp 11 08/04/21 0935  SpO2 97 % 08/04/21 0935  Vitals shown include unvalidated device data.  Last Pain:  Vitals:   08/04/21 0751  PainSc: 0-No pain         Complications: No notable events documented.

## 2021-08-04 NOTE — Op Note (Signed)
Patient:  [name]  DOB:  [birth year]  MRN:  [id number]   Preop Diagnosis: Right right breast carcinoma  Postop Diagnosis: Same  Procedure: Right partial mastectomy  Surgeon: Aviva Signs, MD  Anes: General  Indications: Patient is a 75 year old white female who was found recently on mammography to have a biopsy-proven invasive ductal carcinoma of the right breast.  She has already undergone radiofrequency Teddi and now presents for right partial mastectomy.  The risks and benefits of the procedure including bleeding, infection, and the possibility of unclear margins were fully explained to the patient, who gave informed consent.  Procedure note: The patient was placed in the supine position.  After general anesthesia was administered, the right breast was prepped and draped using the usual sterile technique with ChloraPrep.  Surgical site confirmation was performed.  Using the radiofrequency localizer, an incision was made in the lower, inner quadrant of the right breast.  I then used the localizer to find the radiofrequency tag.  A generous amount of normal breast tissue was freed away down to the chest wall around the ship.  A short suture was placed superiorly and a long suture placed laterally for orientation purposes.  The specimen was sent to mammography.  Specimen radiography revealed the previously placed clip and chip to be in the center of the specimen.  It was then sent to pathology for further examination.  A bleeding was controlled using Bovie electrocautery.  Exparel was instilled into the surrounding wound.  The skin was closed using a 4-0 Monocryl subcuticular suture.  Dermabond was applied.  All tape and needle counts were correct at the end of the procedure.  The patient was awakened and transferred to PACU in stable condition.  Complications: None  EBL: Minimal  Specimen: Right breast tissue

## 2021-08-04 NOTE — Interval H&P Note (Signed)
History and Physical Interval Note:  08/04/2021 8:32 AM  [name]  has presented today for surgery, with the diagnosis of Right Breast Cancer.  The various methods of treatment have been discussed with the patient and family. After consideration of risks, benefits and other options for treatment, the patient has consented to  Procedure(s): MASTECTOMY PARTIAL (Right) BREAST LUMPECTOMY WITH RADIOFREQUENCY TAG IDENTIFICATION (Right) as a surgical intervention.  The patient's history has been reviewed, patient examined, no change in status, stable for surgery.  I have reviewed the patient's chart and labs.  Questions were answered to the patient's satisfaction.     Aviva Signs

## 2021-08-04 NOTE — Anesthesia Preprocedure Evaluation (Signed)
Anesthesia Evaluation  Patient identified by MRN, date of birth, ID band Patient awake    Reviewed: Allergy & Precautions, NPO status , Patient's Chart, lab work & pertinent test results  History of Anesthesia Complications Negative for: history of anesthetic complications  Airway Mallampati: II  TM Distance: >3 FB Neck ROM: Full    Dental  (+) Dental Advisory Given No notable dental injury:   Pulmonary neg pulmonary ROS,    Pulmonary exam normal breath sounds clear to auscultation       Cardiovascular Exercise Tolerance: Good hypertension, Pt. on medications Normal cardiovascular exam Rhythm:Regular Rate:Normal     Neuro/Psych negative neurological ROS  negative psych ROS   GI/Hepatic Neg liver ROS, GERD  Medicated and Controlled,  Endo/Other  Hypothyroidism   Renal/GU negative Renal ROS     Musculoskeletal  (+) Arthritis , Osteoarthritis,    Abdominal   Peds  Hematology negative hematology ROS (+)   Anesthesia Other Findings   Reproductive/Obstetrics negative OB ROS                            Anesthesia Physical Anesthesia Plan  ASA: 2  Anesthesia Plan: General   Post-op Pain Management:    Induction: Intravenous  PONV Risk Score and Plan: 4 or greater and Ondansetron and Dexamethasone  Airway Management Planned: LMA  Additional Equipment:   Intra-op Plan:   Post-operative Plan: Extubation in OR  Informed Consent: I have reviewed the patients History and Physical, chart, labs and discussed the procedure including the risks, benefits and alternatives for the proposed anesthesia with the patient or authorized representative who has indicated his/her understanding and acceptance.     Dental advisory given  Plan Discussed with: CRNA and Surgeon  Anesthesia Plan Comments:        Anesthesia Quick Evaluation

## 2021-08-04 NOTE — Discharge Instructions (Signed)
Please wear Teal exparel bracelet for 96 hrs , may remove   08/08/21 .

## 2021-08-04 NOTE — Anesthesia Postprocedure Evaluation (Signed)
Anesthesia Post Note  Patient: [name]  Procedure(s) Performed: MASTECTOMY PARTIAL (Right) BREAST LUMPECTOMY WITH RADIOFREQUENCY TAG IDENTIFICATION (Right)  Patient location during evaluation: PACU Anesthesia Type: General Level of consciousness: awake and alert and oriented Pain management: pain level controlled Vital Signs Assessment: post-procedure vital signs reviewed and stable Respiratory status: spontaneous breathing and respiratory function stable Cardiovascular status: blood pressure returned to baseline and stable Postop Assessment: no apparent nausea or vomiting Anesthetic complications: no   No notable events documented.   Last Vitals:  Vitals:   08/04/21 1015 08/04/21 1029  BP: 123/76 (!) 134/44  Pulse: 71 72  Resp: 12 12  Temp:  36.6 C  SpO2: 96% 96%    Last Pain:  Vitals:   08/04/21 1029  TempSrc: Oral  PainSc: 0-No pain                 Bradely Rudin C Aubriel Khanna

## 2021-08-04 NOTE — Anesthesia Procedure Notes (Addendum)
Procedure Name: LMA Insertion Date/Time: 08/04/2021 8:45 AM Performed by: Orlie Dakin, CRNA Pre-anesthesia Checklist: Patient identified, Emergency Drugs available, Suction available and Patient being monitored Patient Re-evaluated:Patient Re-evaluated prior to induction Oxygen Delivery Method: Circle system utilized Preoxygenation: Pre-oxygenation with 100% oxygen Induction Type: IV induction LMA: LMA inserted LMA Size: 4.0 Tube type: Oral Number of attempts: 1 Placement Confirmation: positive ETCO2 Tube secured with: Tape Dental Injury: Teeth and Oropharynx as per pre-operative assessment

## 2021-08-05 ENCOUNTER — Encounter (HOSPITAL_COMMUNITY): Payer: Self-pay | Admitting: General Surgery

## 2021-08-06 LAB — SURGICAL PATHOLOGY

## 2021-08-12 ENCOUNTER — Encounter: Payer: Medicare Other | Admitting: General Surgery

## 2021-08-12 ENCOUNTER — Encounter: Payer: Self-pay | Admitting: General Surgery

## 2021-08-12 ENCOUNTER — Ambulatory Visit (INDEPENDENT_AMBULATORY_CARE_PROVIDER_SITE_OTHER): Payer: Medicare Other | Admitting: General Surgery

## 2021-08-12 ENCOUNTER — Other Ambulatory Visit: Payer: Self-pay

## 2021-08-12 VITALS — BP 133/73 | HR 76 | Temp 98.7°F | Resp 12 | Ht 60.0 in | Wt 152.0 lb

## 2021-08-12 DIAGNOSIS — Z09 Encounter for follow-up examination after completed treatment for conditions other than malignant neoplasm: Secondary | ICD-10-CM

## 2021-08-12 NOTE — Progress Notes (Signed)
Subjective:     [name]  Patient here for postoperative visit, status post right partial mastectomy.  She is doing well.  She has no complaints. Objective:    BP 133/73   Pulse 76   Temp 98.7 F (37.1 C) (Other (Comment))   Resp 12   Ht 5' (1.524 m)   Wt 152 lb (68.9 kg)   SpO2 96%   BMI 29.69 kg/m   General:  alert, cooperative, and no distress  Right breast incision healing well. Final pathology reveals invasive ductal carcinoma, 0.9 cm in size, clear margins.  ER/PR positive.     Assessment:    Doing well postoperatively.    Plan:   Will refer to Dr. Delton Coombes of oncology for further evaluation and treatment.  Follow-up here as needed.

## 2021-08-26 NOTE — Progress Notes (Signed)
[facility] [address], [place] [address]   Patient Care Team: [name], MD as PCP - General (Internal Medicine) Brien Mates, RN as Oncology Nurse Navigator (Oncology) Derek Jack, MD as Medical Oncologist (Oncology)  CHIEF COMPLAINTS/PURPOSE OF CONSULTATION:  Newly diagnosed right breast cancer  HISTORY OF PRESENTING ILLNESS:  [name] 75 y.o. female is here because of recent diagnosis of right invasive ductal carcinoma. She underwent a right lumpectomy on 08/04/2021 with Dr. Aviva Signs.   Today she reports feeling good. This was her first abnormal mammogram, and she had never previously needed a biopsy. She denies any prior history of cancer. She reports history of GERD, HTN, and hypothyroidism. She lives at home with her husband. 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 She denies smoking history. A sister with a history of smoking had lung cancer, and another sister had breast cancer. She had a maternal uncle with esophageal cancer.  In terms of breast cancer risk profile:  She menarched at early age of 70 and partial hysterectomy at age 6 She had 3 pregnancy, her first child was born at age 70  She received birth control pills for approximately 10 years.  She was never exposed to fertility medications or hormone replacement therapy.  She has family history of Breast/GYN/GI cancer  I reviewed her records extensively and collaborated the history with the patient.  SUMMARY OF ONCOLOGIC HISTORY: Oncology History   No history exists.   MEDICAL HISTORY:  Past Medical History:  Diagnosis Date   Acid reflux    Breast cancer, right breast (HCC)    HTN (hypertension)    Hypothyroidism    Thyroid disease     SURGICAL HISTORY: Past Surgical History:  Procedure Laterality Date   BLADDER SURGERY     BREAST LUMPECTOMY WITH RADIOFREQUENCY TAG IDENTIFICATION Right 08/04/2021   Procedure: BREAST LUMPECTOMY WITH RADIOFREQUENCY  TAG IDENTIFICATION;  Surgeon: Aviva Signs, MD;  Location: [facility];  Service: General;  Laterality: Right;   CHOLECYSTECTOMY     COLONOSCOPY N/A 12/27/2013   Procedure: COLONOSCOPY;  Surgeon: Rogene Houston, MD;  Location: [facility];  Service: Endoscopy;  Laterality: N/A;  1030   ESOPHAGOGASTRODUODENOSCOPY (EGD) WITH ESOPHAGEAL DILATION N/A 04/20/2013   Procedure: ESOPHAGOGASTRODUODENOSCOPY (EGD) WITH ESOPHAGEAL DILATION;  Surgeon: Rogene Houston, MD;  Location: [facility];  Service: Endoscopy;  Laterality: N/A;  300-moved to 100 Ann to notify pt   MASTECTOMY, PARTIAL Right 08/04/2021   Procedure: MASTECTOMY PARTIAL;  Surgeon: Aviva Signs, MD;  Location: [facility];  Service: General;  Laterality: Right;   PARTIAL HYSTERECTOMY     TUBAL LIGATION     UMBILICAL HERNIA REPAIR      SOCIAL HISTORY: Social History   Socioeconomic History   Marital status: Married    Spouse name: Not on file   Number of children: Not on file   Years of education: 12   Highest education level: Not on file  Occupational History   Not on file  Tobacco Use   Smoking status: Never   Smokeless tobacco: Never  Vaping Use   Vaping Use: Never used  Substance and Sexual Activity   Alcohol use: No   Drug use: No   Sexual activity: Not Currently  Other Topics Concern   Not on file  Social History Narrative   Not on file   Social Determinants of Health   Financial Resource Strain: Low Risk    Difficulty of Paying  Living Expenses: Not hard at all  Food Insecurity: No Food Insecurity   Worried About [place] in the Last Year: Never true   Ran Out of Food in the Last Year: Never true  Transportation Needs: No Transportation Needs   Lack of Transportation (Medical): No   Lack of Transportation (Non-Medical): No  Physical Activity: Sufficiently Active   Days of Exercise per Week: 7 days   Minutes of Exercise per Session: 30 min  Stress: No Stress Concern Present   Feeling of Stress : Only a  little  Social Connections: Moderately Integrated   Frequency of Communication with Friends and Family: More than three times a week   Frequency of Social Gatherings with Friends and Family: More than three times a week   Attends Religious Services: More than 4 times per year   Active Member of [employer] or Organizations: No   Attends Music therapist: Never   Marital Status: Married  Human resources officer Violence: Not At Risk   Fear of Current or Ex-Partner: No   Emotionally Abused: No   Physically Abused: No   Sexually Abused: No    FAMILY HISTORY: Family History  Problem Relation Age of Onset   Heart disease Mother    Emphysema Father    Breast cancer Sister    Heart disease Sister    Lung cancer Sister    Heart disease Sister    Heart disease Brother    Heart disease Brother     ALLERGIES:  is allergic to ancef [cefazolin] and ciprofloxacin.  MEDICATIONS:  Current Outpatient Medications  Medication Sig Dispense Refill   acetaminophen (TYLENOL) 650 MG CR tablet Take 650 mg by mouth every 8 (eight) hours as needed for pain.     ALPRAZolam (XANAX) 0.25 MG tablet Take 0.125 mg by mouth at bedtime as needed for sleep.     anastrozole (ARIMIDEX) 1 MG tablet Take 1 tablet (1 mg total) by mouth daily. 90 tablet 1   Biotin 1000 MCG tablet Take 1,000 mcg by mouth daily.     calcium carbonate (TUMS - DOSED IN MG ELEMENTAL CALCIUM) 500 MG chewable tablet Chew 500 mg by mouth daily as needed for indigestion or heartburn.     Cyanocobalamin (B-12 SL) Place 1 mL under the tongue 3 (three) times a week.     ibuprofen (ADVIL) 200 MG tablet Take 200 mg by mouth every 6 (six) hours as needed for moderate pain.     levothyroxine (SYNTHROID, LEVOTHROID) 75 MCG tablet Take 75 mcg by mouth daily before breakfast.      lisinopril-hydrochlorothiazide (PRINZIDE,ZESTORETIC) 20-12.5 MG per tablet Take 1 tablet by mouth every morning.      lovastatin (MEVACOR) 20 MG tablet Take 20 mg by mouth  at bedtime.     Magnesium 250 MG TABS Take 250 mg by mouth 2 (two) times daily.     omeprazole (PRILOSEC) 20 MG capsule Take 20 mg by mouth every morning.      aspirin EC 81 MG tablet Take 81 mg by mouth every other day. (Patient not taking: Reported on 08/27/2021)     No current facility-administered medications for this visit.    REVIEW OF SYSTEMS:   Review of Systems  Constitutional:  Negative for appetite change (80%) and fatigue (80%).  Psychiatric/Behavioral:  The patient is nervous/anxious.   All other systems reviewed and are negative.  PHYSICAL EXAMINATION: ECOG PERFORMANCE STATUS: 1 - Symptomatic but completely ambulatory  There were no vitals  filed for this visit. There were no vitals filed for this visit. Physical Exam Vitals reviewed.  Constitutional:      Appearance: Normal appearance.  Cardiovascular:     Rate and Rhythm: Normal rate and regular rhythm.     Pulses: Normal pulses.     Heart sounds: Normal heart sounds.  Pulmonary:     Effort: Pulmonary effort is normal.     Breath sounds: Normal breath sounds.  Chest:  Breasts:    Right: Skin change (lumpectomy scar in LIQ well healed) and tenderness present. No mass.     Left: Normal.  Abdominal:     Palpations: Abdomen is soft. There is no hepatomegaly, splenomegaly or mass.     Tenderness: There is no abdominal tenderness.  Musculoskeletal:     Right lower leg: No edema.     Left lower leg: No edema.  Lymphadenopathy:     Upper Body:     Right upper body: No supraclavicular, axillary or pectoral adenopathy.     Left upper body: No supraclavicular, axillary or pectoral adenopathy.  Neurological:     General: No focal deficit present.     Mental Status: She is alert and oriented to person, place, and time.  Psychiatric:        Mood and Affect: Mood normal.        Behavior: Behavior normal.    Breast Exam Chaperone: Thana Ates    LABORATORY DATA:  I have reviewed the data as listed Recent  Results (from the past 2160 hour(s))  Surgical pathology     Status: None   Collection Time: 07/11/21  1:34 PM  Result Value Ref Range   SURGICAL PATHOLOGY      SURGICAL PATHOLOGY *THIS IS AN ADDENDUM REPORT* CASE: [id number] PATIENT: [facility] **Addendum **  Reason for Addendum #1:  Breast Biomarker Results Reason for Addendum #2:  Breast Biomarker Results  Clinical History: suspicious 8 mm right breast mass     FINAL MICROSCOPIC DIAGNOSIS:  A. BREAST, 5:00, RIGHT, BIOPSY: - Invasive ductal carcinoma.  See comment    COMMENT:  Carcinoma measures 0.7 cm in greatest linear dimension and appears grade 2.  Dr. Martinique has reviewed the case and concurs with the diagnosis.  A breast prognostic profile (ER, PR, Ki-67 and HER2) is pending and will be reported in an addendum.  Ms. Electa Sniff was notified on 07/14/2021.     GROSS DESCRIPTION:  Received in formalin, time in formalin and cold ischemic time unknown, are 4 cores of soft tan-yellow tissue measuring 1.0 to 1.7 cm in length and each 0.2 cm in diameter.  The specimen is submitted in toto.  [facility] 07/11/2021)   Final Diagnosis performed by Jaquita Folds, MD.   Electronically signed 07/14/2021 Technical component performed at [facility], [place] [address], [place], [place] [address].  Professional component performed at [facility], [place] [address], [place], [place] [address].  Immunohistochemistry Technical component (if applicable) was performed at [facility] [address] [place], [place], [place] [address].   IMMUNOHISTOCHEMISTRY DISCLAIMER (if applicable): Some of these immunohistochemical stains may have been developed and the performance characteristics determine by [facility]. Some may not have been cleared or approved by the U.S. Food and Drug Administration. The FDA has determined that such clearance or  approval is not necessary. This test is used for clinical purposes. It should not be regarded as investigational or for research. This laboratory is  certified under the [place] (CLIA-88) as qualified to perform high complexity clinical laboratory testing.  The controls stained appropriately.  ADDENDUM:  PROGNOSTIC INDICATOR RESULTS:  Immunohistochemical and morphometric analysis performed manually  The tumor cells are EQUIVOCAL for Her2 (2+). Her2 FISH has been ordered and will be reported in an addendum.  Estrogen Receptor:       POSITIVE, 90%, STRONG STAINING Progesterone Receptor:   POSITIVE, 80%, STRONG STAINING Proliferation Marker Ki-67:   10%  Comment: The negative hormone receptor study(ies) in the case has an internal positive control.  Reference Range Estrogen and Progesterone Receptor      Negative  0%      Positive  >1%  All controls stained appropriately.          Addendum #1 performed by Vicente Males, MD.   Electronically signed 07/15/2021 Technical component performed at [facility], [place] [address] [place], [place] [address].  Professional component performed at [facility], [place] [address], [place], [place] [address].  Immunohistochemistry Technical component (if applicable) was performed at [facility]. [address] [place], [place], [place] [address].   IMMUNOHISTOCHEMISTRY DISCLAIMER (if applicable): Some of these immunohistochemical stains may have been developed and the performance characteristics determine by [facility]. Some may not have been cleared or approved by the U.S. Food and Drug Administration. The FDA has determined that such clearance or approval is not necessary. This test is used for clinical purposes. It should not be regarded as investigational or for research. This laboratory is certified under the [place] (CLIA-88) as qualified to perform high complexity clinical laboratory testing.  The controls stained appropriately.  ADDENDUM:  FLOURESCENCE IN-SITU HYBRIDIZATION RESULTS:  GROUP 5:   HER2 **NEGATIVE**   On the tissue sample received from this individual HER2 FISH was performed by a technologist and cell imaging and analysis on the [employer].  RATIO of HER2/CEN 17 SIGNALS:           1.57 AVERAGE HER2 COPY NUMBER PER CELL: 3.45  The ratio of HER-2 CEP 17 is within the range < 2.0 of HER-2: CEP17 in normal breast tissue and a copy number of HER2 signals per cell is <4.0.  Arch Pathol Lab Med 1:1,2018           Addendum #2 performed by Vicente Males, MD.   Electronically signed 07/16/2021 Technical component performed at [facility], [place] [address], [place], [place] [address].  Professional component performed at [facility], [place] [address], [place], [place] [address].  Immunohistochemistry Technical component (if applicable) was performed at [facility]. [address], [place], [place],  [address].   IMMUNOHISTOCHEMISTRY DISCLAIMER (if applicable): Some of these immunohistochemical stains may hav e been developed and the performance characteristics determine by [facility]. Some may not have been cleared or approved by the U.S. Food and Drug Administration. The FDA has determined that such clearance or approval is not necessary. This test is used for clinical purposes. It should not be regarded as investigational or for research. This laboratory is certified under the [place] (CLIA-88) as qualified to perform high complexity clinical laboratory testing.  The controls stained appropriately.   CBC WITH DIFFERENTIAL     Status: None   Collection Time: 07/31/21  3:12 PM  Result Value Ref Range   WBC 10.2 4.0 -  10.5 K/uL   RBC 4.93 3.87 - 5.11 MIL/uL   Hemoglobin 14.3 12.0 - 15.0 g/dL   HCT 43.8 36.0 - 46.0 %   MCV 88.8 80.0 - 100.0 fL   MCH 29.0 26.0 - 34.0 pg   MCHC 32.6 30.0 - 36.0 g/dL   RDW 14.4 11.5 - 15.5 %   Platelets 285 150 - 400 K/uL   nRBC 0.0 0.0 - 0.2 %   Neutrophils Relative % 65 %   Neutro Abs 6.7 1.7 - 7.7 K/uL   Lymphocytes Relative 26 %   Lymphs Abs 2.6 0.7 - 4.0 K/uL   Monocytes Relative 6 %   Monocytes Absolute 0.6 0.1 - 1.0 K/uL   Eosinophils Relative 2 %   Eosinophils Absolute 0.2 0.0 - 0.5 K/uL   Basophils Relative 1 %   Basophils Absolute 0.1 0.0 - 0.1 K/uL   Immature Granulocytes 0 %   Abs Immature Granulocytes 0.03 0.00 - 0.07 K/uL    Comment: Performed at [facility], [address], [place], [place] [address]  Comprehensive metabolic panel     Status: None   Collection Time: 07/31/21  3:12 PM  Result Value Ref Range   Sodium 139 135 - 145 mmol/L   Potassium 3.5 3.5 - 5.1 mmol/L   Chloride 104 98 - 111 mmol/L   CO2 25 22 - 32 mmol/L   Glucose, Bld 90 70 - 99 mg/dL    Comment: Glucose reference range applies only to samples taken after fasting for at least 8 hours.   BUN 21 8 - 23 mg/dL   Creatinine, Ser 0.79 0.44 - 1.00 mg/dL   Calcium 9.3 8.9 - 10.3 mg/dL   Total Protein 7.1 6.5 - 8.1 g/dL   Albumin 4.1 3.5 - 5.0 g/dL   AST 20 15 - 41 U/L   ALT 21 0 - 44 U/L   Alkaline Phosphatase 81 38 - 126 U/L   Total Bilirubin 0.7 0.3 - 1.2 mg/dL   GFR, Estimated >60 >60 mL/min    Comment: (NOTE) Calculated using the CKD-EPI Creatinine Equation (2021)    Anion gap 10 5 - 15    Comment: Performed at [facility], [address], [place], [place] [address]  Surgical pathology     Status: None   Collection Time: 08/04/21  9:14 AM  Result Value Ref Range   SURGICAL PATHOLOGY      SURGICAL PATHOLOGY CASE: [id number] PATIENT: [facility]ology Report     Clinical History: right breast cancer     FINAL MICROSCOPIC DIAGNOSIS:  A.  BREAST, MASS, RIGHT, LUMPECTOMY: - Invasive ductal carcinoma, grade 1, spanning 0.9 cm. - Invasive carcinoma is 3 mm from the lateral margin. - Biopsy site. - See oncology table.  ONCOLOGY TABLE:  INVASIVE CARCINOMA OF THE BREAST:  Resection  Procedure: Right lumpectomy Specimen Laterality: Right Histologic Type: Invasive ductal carcinoma Histologic Grade:      Glandular (Acinar)/Tubular Differentiation: 2      Nuclear Pleomorphism: 2      Mitotic Rate: 1      Overall Grade: 1 Tumor Size: 9 mm Ductal Carcinoma In Situ: Not identified Tumor Extent: Limited to breast parenchyma Treatment Effect in the Breast: No known presurgical therapy Margins: All margins negative for invasive carcinoma      Distance from Closest Margin (mm): 3 mm      Specify Closest Margin (required only if <71m): L ateral DCIS Margins: Not applicable Regional Lymph Nodes: Not applicable (no lymph  nodes submitted or found) Distant Metastasis:      Distant Site(s) Involved: Not applicable Breast Biomarker Testing Performed on Previous Biopsy:      Testing Performed on Case Number: [id number]            Estrogen Receptor: Positive, 90%, strong staining            Progesterone Receptor: Positive, 80%, strong staining            HER2: Equivocal reflex to negative FISH            Ki-67: 10% Pathologic Stage Classification (pTNM, AJCC 8th Edition): pT1b, pN not assigned Representative Tumor Block: A1 Comment(s): None.  (v4.5.0.0)    GROSS DESCRIPTION:  Specimen type: Right breast lumpectomy, received in formalin, time in formalin 9:29 AM on 08/04/2021. Size: 5.2 cm at the anterior-posterior axis, 7.1 cm at the superior-inferior axis and 3.1 cm at the lateral-medial axis. Orientation: The specimen is oriented with a short suture at superior and long suture at lateral.  The margi ns are inked with the anterior green, inferior blue, lateral orange, medial yellow, posterior black and superior  red. Localized area: The specimen is not localized. Cut surface: There is a centrally located, 0.9 x 0.9 x 0.8 cm indurated white nodule with ill-defined borders.  There is a radiofrequency device and a silver metallic biopsy clip at the nodule.  The remainder the breast tissue consists of soft yellow adipose tissue and white fibrous tissue. Margins: The nodule is located 0.5 cm from the lateral and medial margins.  The remaining margins measure greater than 1 cm. Prognostic indicators: Obtained from paraffin blocks if needed Block summary: 5 blocks submitted 1-3 = entire nodule to include the lateral and medial margins 4 = superior and inferior margins 5 = anterior and posterior margins (GRP 08/05/2021)     Final Diagnosis performed by Vicente Males, MD.   Electronically signed 08/06/2021 Technical component performed at [facility], [address] [place], [place] [address].  Professional component performed at [facility], [place] [address], [place], [place] [address].  Immunohistochemistry Technical component (if applicable) was performed at [facility]. [address] [place], [place], [place] [address].   IMMUNOHISTOCHEMISTRY DISCLAIMER (if applicable): Some of these immunohistochemical stains may have been developed and the performance characteristics determine by [facility]. Some may not have been cleared or approved by the U.S. Food and Drug Administration. The FDA has determined that such clearance or approval is not necessary. This test is used for clinical purposes. It should not be regarded as investigational or for research. This laboratory is certified under the [place] (CLIA-88) as qualified to perform high complexity clinical laboratory testing.  The controls stained appropriately.     RADIOGRAPHIC STUDIES: I have personally reviewed the  radiological reports and agreed with the findings in the report. Chest 2 View  Result Date: 08/01/2021 CLINICAL DATA:  Preop for breast surgery. EXAM: CHEST - 2 VIEW COMPARISON:  May 24, 2006. FINDINGS: The heart size and mediastinal contours are within normal limits. Both lungs are clear. The visualized skeletal structures are unremarkable. IMPRESSION: No active cardiopulmonary disease. Electronically Signed   By: Marijo Conception M.D.   On: 08/01/2021 15:50   MM BREAST SURGICAL SPECIMEN  Result Date: 08/05/2021 CLINICAL DATA:  Status post RF tag localized right breast lumpectomy. EXAM: SPECIMEN RADIOGRAPH OF THE right BREAST COMPARISON:  Previous exam(s). FINDINGS: Status post excision  of the right breast. The RF tag and coil shaped biopsy marking clip with associated mass are all present within the specimen. IMPRESSION: Specimen radiograph of the right breast. Electronically Signed   By: Everlean Alstrom M.D.   On: 08/05/2021 10:47  MM DIAG BREAST TOMO UNI RIGHT  Result Date: 07/29/2021 CLINICAL DATA:  Post ultrasound-guided placement of an RF tag the 5 o'clock position/inframammary fold region of the right breast at site of recently diagnosed invasive ductal carcinoma. EXAM: DIAGNOSTIC RIGHT MAMMOGRAM POST ULTRASOUND-GUIDED RF TAG PLACEMENT COMPARISON:  Previous exams. FINDINGS: Mammographic images were obtained following ultrasound-guided RF tag placement. Note that only an MLO view was obtained as this mass could not be visualized on the prior CC views. The RF tag is visualized adjacent to the biopsy marking clip in the right breast at the 5 o'clock position. IMPRESSION: Appropriate location of the radiofrequency tag in the right breast at the 5 o'clock position. Final Assessment: Post Procedure Mammograms for Seed Placement Electronically Signed   By: Everlean Alstrom M.D.   On: 07/29/2021 09:36  [place] RT RADIO FREQUENCY TAG LOC US GUIDE  Result Date: 07/29/2021 CLINICAL DATA:  75 year old female  with recently diagnosed invasive ductal carcinoma of the right breast at the 5 o'clock position in the region of the inframammary fold. Patient presents for preoperative radiofrequency tag localization. EXAM: NEEDLE LOCALIZATION OF THE RIGHT BREAST WITH ULTRASOUND GUIDANCE COMPARISON:  Previous exams. FINDINGS: Patient presents for needle localization prior to right breast lumpectomy. I met with the patient and we discussed the procedure of needle localization including benefits and alternatives. We discussed the high likelihood of a successful procedure. We discussed the risks of the procedure, including infection, bleeding, tissue injury, and further surgery. Informed, written consent was given. The usual time-out protocol was performed immediately prior to the procedure. Using ultrasound guidance, sterile technique, 1% lidocaine and a 5 cm RF tag needle, the mass with associated biopsy marking clip was localized using a lateral to medial approach. RF tag function was confirmed. The images were marked for Dr. Arnoldo Morale. IMPRESSION: Radiofrequency tag localization of the right breast. No apparent complications. Electronically Signed   By: Everlean Alstrom M.D.   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 - Right breast lumpectomy on 08/04/2021 - Pathology consistent with 0.9 cm IDC, grade 1, free margins, PT1BPNX.  2.  Social/family history: - Lives at home with her husband.  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 - Younger sister had lung cancer and was a smoker.  Older sister had breast cancer.  Maternal uncle had esophageal cancer.   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 -  She has a small tumor, which is low-grade with Ki-67 around 10%. - We discussed the utility of Oncotype DX in this setting.  However given the small size and low-grade, we have recommended antiestrogen therapy with anastrozole for at least 5 years. - We discussed side effects of anastrozole in detail. - We will make a referral to radiation oncology. - RTC 3 months for follow-up to assess tolerability.  2.  Bone health: - We will obtain a bone density test prior to next visit.  We will also  check vitamin D levels. - Recommend calcium and vitamin D supplements daily.  Recommend weightbearing exercises.    Derek Jack, MD 08/27/21 4:43 PM  [place] [id number]   I, Thana Ates, am acting as a scribe for Dr. Derek Jack.  I, Derek Jack MD, have reviewed the above documentation for accuracy and completeness, and I agree with the above.

## 2021-08-27 ENCOUNTER — Encounter (HOSPITAL_COMMUNITY): Payer: Self-pay | Admitting: Hematology

## 2021-08-27 ENCOUNTER — Inpatient Hospital Stay (HOSPITAL_COMMUNITY): Payer: Medicare Other | Attending: Hematology | Admitting: Hematology

## 2021-08-27 ENCOUNTER — Other Ambulatory Visit: Payer: Self-pay

## 2021-08-27 DIAGNOSIS — Z79899 Other long term (current) drug therapy: Secondary | ICD-10-CM | POA: Diagnosis not present

## 2021-08-27 DIAGNOSIS — Z801 Family history of malignant neoplasm of trachea, bronchus and lung: Secondary | ICD-10-CM | POA: Diagnosis not present

## 2021-08-27 DIAGNOSIS — Z803 Family history of malignant neoplasm of breast: Secondary | ICD-10-CM | POA: Diagnosis not present

## 2021-08-27 DIAGNOSIS — Z79811 Long term (current) use of aromatase inhibitors: Secondary | ICD-10-CM | POA: Diagnosis not present

## 2021-08-27 DIAGNOSIS — Z170 Estrogen receptor positive status [ER+]: Secondary | ICD-10-CM | POA: Diagnosis not present

## 2021-08-27 DIAGNOSIS — C50311 Malignant neoplasm of lower-inner quadrant of right female breast: Secondary | ICD-10-CM | POA: Diagnosis not present

## 2021-08-27 DIAGNOSIS — E039 Hypothyroidism, unspecified: Secondary | ICD-10-CM | POA: Diagnosis not present

## 2021-08-27 MED ORDER — ANASTROZOLE 1 MG PO TABS: 1.0000 mg | ORAL_TABLET | Freq: Every day | ORAL | 1 refills | Status: DC

## 2021-08-27 NOTE — Patient Instructions (Addendum)
[place] at [facility] Discharge Instructions  You were seen and examined today by Dr. Delton Coombes. Dr. Delton Coombes is a medical oncologist, meaning he specializes in the management of cancer diagnoses. Dr. Delton Coombes discussed your past medical history, family history of cancer, and the events that led to you being here today.  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 It is estrogen receptor and progesterone receptor positive, meaning it is fed by the hormones that occur naturally in the body. The cancer was completely removed during surgery, this is great news! There is no need for chemotherapy, and most likely no need for radiation therapy, but it is still recommended to prevent cancer recurrence with an anti-estrogen (Anastrozole) pill that is taken once daily for at least 5 years.  Start taking Calcium and Vitamin D supplements that you get over the counter. Also try to do light weight-bearing exercises. This will ensure bone strength.  Dr. Delton Coombes has recommended a bone density scan to see your baseline bone strength. Dr. Delton Coombes will also refer you to speak with the Radiation Oncologist in [place], [place] to see if there is any role for radiation. Dr. Delton Coombes will see you back in about 3 months for follow-up.   Thank you for choosing [place] at [facility] to provide your oncology and hematology care.  To afford each patient quality time with our provider, please arrive at least 15 minutes before your scheduled appointment time.   If you have a lab appointment with the [place] please come in thru the Main Entrance and check in at the main information desk.  You need to re-schedule your appointment should you arrive 10 or more minutes late.  We strive to give you quality time with our providers, and arriving late affects you and other patients whose appointments are after yours.  Also, if you no show three or more times for  appointments you may be dismissed from the clinic at the providers discretion.     Again, thank you for choosing [facility].  Our hope is that these requests will decrease the amount of time that you wait before being seen by our physicians.       _____________________________________________________________  Should you have questions after your visit to [facility], please contact our office at [phone] and follow the prompts.  Our office hours are 8:00 a.m. and 4:30 p.m. Monday - Friday.  Please note that voicemails left after 4:00 p.m. may not be returned until the following business day.  We are closed weekends and major holidays.  You do have access to a nurse 24-7, just call the main number to the clinic [phone] and do not press any options, hold on the line and a nurse will answer the phone.    For prescription refill requests, have your pharmacy contact our office and allow 72 hours.    Due to Covid, you will need to wear a mask upon entering the hospital. If you do not have a mask, a mask will be given to you at the Main Entrance upon arrival. For doctor visits, patients may have 1 support person age 29 or older with them. For treatment visits, patients can not have anyone with them due to social distancing guidelines and our immunocompromised population.

## 2021-08-28 ENCOUNTER — Encounter (HOSPITAL_COMMUNITY): Payer: Self-pay | Admitting: Lab

## 2021-08-28 NOTE — Progress Notes (Unsigned)
Referral sent to [facility].  Records faxed on 9/15

## 2021-09-03 DIAGNOSIS — Z170 Estrogen receptor positive status [ER+]: Secondary | ICD-10-CM | POA: Diagnosis not present

## 2021-09-03 DIAGNOSIS — I10 Essential (primary) hypertension: Secondary | ICD-10-CM | POA: Diagnosis not present

## 2021-09-03 DIAGNOSIS — E7800 Pure hypercholesterolemia, unspecified: Secondary | ICD-10-CM | POA: Diagnosis not present

## 2021-09-03 DIAGNOSIS — C50311 Malignant neoplasm of lower-inner quadrant of right female breast: Secondary | ICD-10-CM | POA: Diagnosis not present

## 2021-09-10 DIAGNOSIS — C50311 Malignant neoplasm of lower-inner quadrant of right female breast: Secondary | ICD-10-CM | POA: Diagnosis not present

## 2021-09-10 DIAGNOSIS — Z170 Estrogen receptor positive status [ER+]: Secondary | ICD-10-CM | POA: Diagnosis not present

## 2021-09-10 DIAGNOSIS — E7800 Pure hypercholesterolemia, unspecified: Secondary | ICD-10-CM | POA: Diagnosis not present

## 2021-09-10 DIAGNOSIS — I10 Essential (primary) hypertension: Secondary | ICD-10-CM | POA: Diagnosis not present

## 2021-09-12 DIAGNOSIS — E785 Hyperlipidemia, unspecified: Secondary | ICD-10-CM | POA: Diagnosis not present

## 2021-09-12 DIAGNOSIS — K219 Gastro-esophageal reflux disease without esophagitis: Secondary | ICD-10-CM | POA: Diagnosis not present

## 2021-09-12 DIAGNOSIS — I10 Essential (primary) hypertension: Secondary | ICD-10-CM | POA: Diagnosis not present

## 2021-09-15 DIAGNOSIS — Z0001 Encounter for general adult medical examination with abnormal findings: Secondary | ICD-10-CM | POA: Diagnosis not present

## 2021-09-15 DIAGNOSIS — E785 Hyperlipidemia, unspecified: Secondary | ICD-10-CM | POA: Diagnosis not present

## 2021-09-15 DIAGNOSIS — E039 Hypothyroidism, unspecified: Secondary | ICD-10-CM | POA: Diagnosis not present

## 2021-09-15 DIAGNOSIS — I471 Supraventricular tachycardia: Secondary | ICD-10-CM | POA: Diagnosis not present

## 2021-09-15 DIAGNOSIS — C50911 Malignant neoplasm of unspecified site of right female breast: Secondary | ICD-10-CM | POA: Diagnosis not present

## 2021-09-16 DIAGNOSIS — I10 Essential (primary) hypertension: Secondary | ICD-10-CM | POA: Diagnosis not present

## 2021-09-16 DIAGNOSIS — C50311 Malignant neoplasm of lower-inner quadrant of right female breast: Secondary | ICD-10-CM | POA: Diagnosis not present

## 2021-09-16 DIAGNOSIS — Z170 Estrogen receptor positive status [ER+]: Secondary | ICD-10-CM | POA: Diagnosis not present

## 2021-09-16 DIAGNOSIS — E7800 Pure hypercholesterolemia, unspecified: Secondary | ICD-10-CM | POA: Diagnosis not present

## 2021-09-19 DIAGNOSIS — Z170 Estrogen receptor positive status [ER+]: Secondary | ICD-10-CM | POA: Diagnosis not present

## 2021-09-19 DIAGNOSIS — C50311 Malignant neoplasm of lower-inner quadrant of right female breast: Secondary | ICD-10-CM | POA: Diagnosis not present

## 2021-09-19 DIAGNOSIS — E7800 Pure hypercholesterolemia, unspecified: Secondary | ICD-10-CM | POA: Diagnosis not present

## 2021-09-19 DIAGNOSIS — I10 Essential (primary) hypertension: Secondary | ICD-10-CM | POA: Diagnosis not present

## 2021-09-22 DIAGNOSIS — E7800 Pure hypercholesterolemia, unspecified: Secondary | ICD-10-CM | POA: Diagnosis not present

## 2021-09-22 DIAGNOSIS — I10 Essential (primary) hypertension: Secondary | ICD-10-CM | POA: Diagnosis not present

## 2021-09-22 DIAGNOSIS — Z170 Estrogen receptor positive status [ER+]: Secondary | ICD-10-CM | POA: Diagnosis not present

## 2021-09-22 DIAGNOSIS — C50311 Malignant neoplasm of lower-inner quadrant of right female breast: Secondary | ICD-10-CM | POA: Diagnosis not present

## 2021-09-23 DIAGNOSIS — C50311 Malignant neoplasm of lower-inner quadrant of right female breast: Secondary | ICD-10-CM | POA: Diagnosis not present

## 2021-09-23 DIAGNOSIS — Z170 Estrogen receptor positive status [ER+]: Secondary | ICD-10-CM | POA: Diagnosis not present

## 2021-09-23 DIAGNOSIS — E7800 Pure hypercholesterolemia, unspecified: Secondary | ICD-10-CM | POA: Diagnosis not present

## 2021-09-23 DIAGNOSIS — I10 Essential (primary) hypertension: Secondary | ICD-10-CM | POA: Diagnosis not present

## 2021-09-24 DIAGNOSIS — Z170 Estrogen receptor positive status [ER+]: Secondary | ICD-10-CM | POA: Diagnosis not present

## 2021-09-24 DIAGNOSIS — C50311 Malignant neoplasm of lower-inner quadrant of right female breast: Secondary | ICD-10-CM | POA: Diagnosis not present

## 2021-09-24 DIAGNOSIS — I10 Essential (primary) hypertension: Secondary | ICD-10-CM | POA: Diagnosis not present

## 2021-09-24 DIAGNOSIS — E7800 Pure hypercholesterolemia, unspecified: Secondary | ICD-10-CM | POA: Diagnosis not present

## 2021-09-25 DIAGNOSIS — Z170 Estrogen receptor positive status [ER+]: Secondary | ICD-10-CM | POA: Diagnosis not present

## 2021-09-25 DIAGNOSIS — C50311 Malignant neoplasm of lower-inner quadrant of right female breast: Secondary | ICD-10-CM | POA: Diagnosis not present

## 2021-09-25 DIAGNOSIS — I10 Essential (primary) hypertension: Secondary | ICD-10-CM | POA: Diagnosis not present

## 2021-09-25 DIAGNOSIS — E7800 Pure hypercholesterolemia, unspecified: Secondary | ICD-10-CM | POA: Diagnosis not present

## 2021-09-26 DIAGNOSIS — I10 Essential (primary) hypertension: Secondary | ICD-10-CM | POA: Diagnosis not present

## 2021-09-26 DIAGNOSIS — C50311 Malignant neoplasm of lower-inner quadrant of right female breast: Secondary | ICD-10-CM | POA: Diagnosis not present

## 2021-09-26 DIAGNOSIS — Z170 Estrogen receptor positive status [ER+]: Secondary | ICD-10-CM | POA: Diagnosis not present

## 2021-09-26 DIAGNOSIS — E7800 Pure hypercholesterolemia, unspecified: Secondary | ICD-10-CM | POA: Diagnosis not present

## 2021-10-28 DIAGNOSIS — Z170 Estrogen receptor positive status [ER+]: Secondary | ICD-10-CM | POA: Diagnosis not present

## 2021-10-28 DIAGNOSIS — E7800 Pure hypercholesterolemia, unspecified: Secondary | ICD-10-CM | POA: Diagnosis not present

## 2021-10-28 DIAGNOSIS — I10 Essential (primary) hypertension: Secondary | ICD-10-CM | POA: Diagnosis not present

## 2021-10-28 DIAGNOSIS — C50311 Malignant neoplasm of lower-inner quadrant of right female breast: Secondary | ICD-10-CM | POA: Diagnosis not present

## 2021-11-24 ENCOUNTER — Ambulatory Visit (HOSPITAL_COMMUNITY)
Admission: RE | Admit: 2021-11-24 | Discharge: 2021-11-24 | Disposition: A | Discharge status: Home / Self Care | Payer: Medicare Other | Source: Ambulatory Visit | Attending: Hematology | Admitting: Hematology

## 2021-11-24 ENCOUNTER — Other Ambulatory Visit: Payer: Self-pay

## 2021-11-24 ENCOUNTER — Inpatient Hospital Stay (HOSPITAL_COMMUNITY): Payer: Medicare Other | Attending: Hematology

## 2021-11-24 DIAGNOSIS — Z170 Estrogen receptor positive status [ER+]: Secondary | ICD-10-CM | POA: Insufficient documentation

## 2021-11-24 DIAGNOSIS — Z13820 Encounter for screening for osteoporosis: Secondary | ICD-10-CM | POA: Insufficient documentation

## 2021-11-24 DIAGNOSIS — C50311 Malignant neoplasm of lower-inner quadrant of right female breast: Secondary | ICD-10-CM | POA: Diagnosis not present

## 2021-11-24 DIAGNOSIS — M8589 Other specified disorders of bone density and structure, multiple sites: Secondary | ICD-10-CM | POA: Insufficient documentation

## 2021-11-24 DIAGNOSIS — C50911 Malignant neoplasm of unspecified site of right female breast: Secondary | ICD-10-CM | POA: Insufficient documentation

## 2021-11-24 DIAGNOSIS — Z780 Asymptomatic menopausal state: Secondary | ICD-10-CM | POA: Diagnosis not present

## 2021-11-24 DIAGNOSIS — Z803 Family history of malignant neoplasm of breast: Secondary | ICD-10-CM | POA: Insufficient documentation

## 2021-11-24 DIAGNOSIS — Z801 Family history of malignant neoplasm of trachea, bronchus and lung: Secondary | ICD-10-CM | POA: Insufficient documentation

## 2021-11-24 DIAGNOSIS — Z79811 Long term (current) use of aromatase inhibitors: Secondary | ICD-10-CM | POA: Insufficient documentation

## 2021-11-24 LAB — CBC WITH DIFFERENTIAL/PLATELET
Abs Immature Granulocytes: 0.04 10*3/uL (ref 0.00–0.07)
Basophils Absolute: 0.1 10*3/uL (ref 0.0–0.1)
Basophils Relative: 1 %
Eosinophils Absolute: 0.2 10*3/uL (ref 0.0–0.5)
Eosinophils Relative: 3 %
HCT: 44.5 % (ref 36.0–46.0)
Hemoglobin: 14.6 g/dL (ref 12.0–15.0)
Immature Granulocytes: 0 %
Lymphocytes Relative: 22 %
Lymphs Abs: 2.1 10*3/uL (ref 0.7–4.0)
MCH: 29.9 pg (ref 26.0–34.0)
MCHC: 32.8 g/dL (ref 30.0–36.0)
MCV: 91 fL (ref 80.0–100.0)
Monocytes Absolute: 0.6 10*3/uL (ref 0.1–1.0)
Monocytes Relative: 6 %
Neutro Abs: 6.4 10*3/uL (ref 1.7–7.7)
Neutrophils Relative %: 68 %
Platelets: 249 10*3/uL (ref 150–400)
RBC: 4.89 MIL/uL (ref 3.87–5.11)
RDW: 13.5 % (ref 11.5–15.5)
WBC: 9.5 10*3/uL (ref 4.0–10.5)
nRBC: 0 % (ref 0.0–0.2)

## 2021-11-24 LAB — COMPREHENSIVE METABOLIC PANEL
ALT: 18 U/L (ref 0–44)
AST: 18 U/L (ref 15–41)
Albumin: 4.1 g/dL (ref 3.5–5.0)
Alkaline Phosphatase: 89 U/L (ref 38–126)
Anion gap: 9 (ref 5–15)
BUN: 27 mg/dL — ABNORMAL HIGH (ref 8–23)
CO2: 27 mmol/L (ref 22–32)
Calcium: 9.7 mg/dL (ref 8.9–10.3)
Chloride: 103 mmol/L (ref 98–111)
Creatinine, Ser: 0.73 mg/dL (ref 0.44–1.00)
GFR, Estimated: >60 mL/min (ref >60)
Glucose, Bld: 95 mg/dL (ref 70–99)
Potassium: 4.9 mmol/L (ref 3.5–5.1)
Sodium: 139 mmol/L (ref 135–145)
Total Bilirubin: 0.6 mg/dL (ref 0.3–1.2)
Total Protein: 7.5 g/dL (ref 6.5–8.1)

## 2021-11-24 LAB — VITAMIN D 25 HYDROXY (VIT D DEFICIENCY, FRACTURES): Vit D, 25-Hydroxy: 33.87 ng/mL (ref 30–100)

## 2021-11-26 IMAGING — US US PLC BREAST LOC DEV 1ST LEASION INC US GUIDE*R*
1 series · 8 of 8 positions shown · non-contrast
Comparison: Previous exams.

CLINICAL DATA: 75-year-old female with recently diagnosed invasive
ductal carcinoma of the right breast at the 5 o'clock position in
the region of the inframammary fold. Patient presents for
preoperative radiofrequency tag localization.

EXAM:
NEEDLE LOCALIZATION OF THE RIGHT BREAST WITH ULTRASOUND GUIDANCE

[Series 1: us plc breast loc dev 1st leasion inc us guide*rig · 0.06mm/px · 8 of 8 slices shown]
[im 1/8]
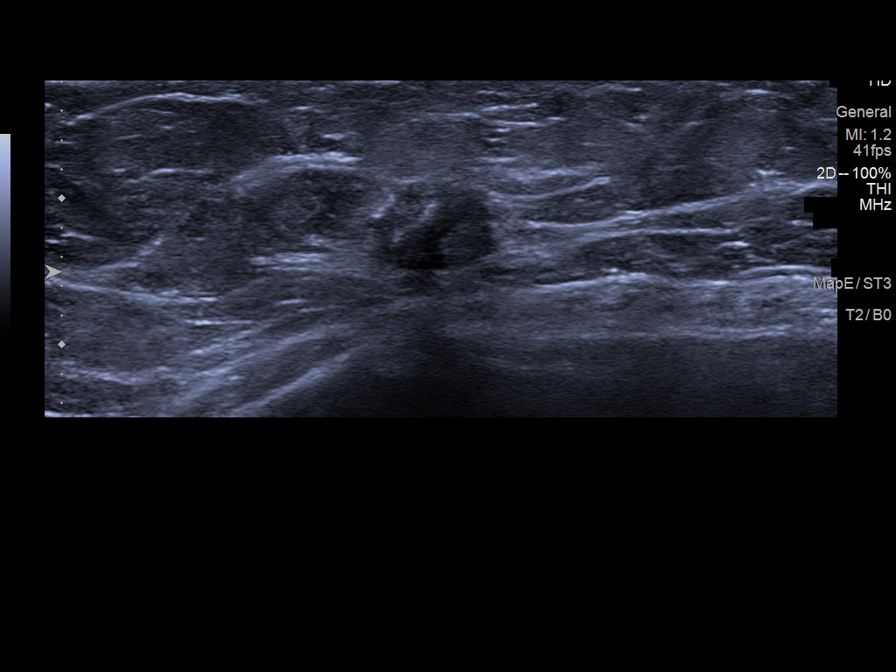
[im 2/8]
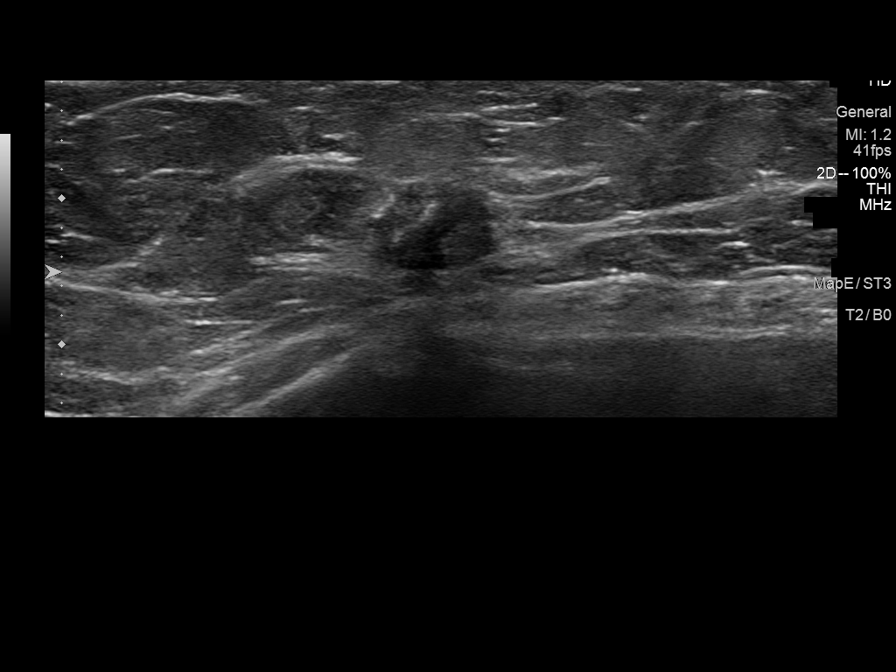
[im 3/8]
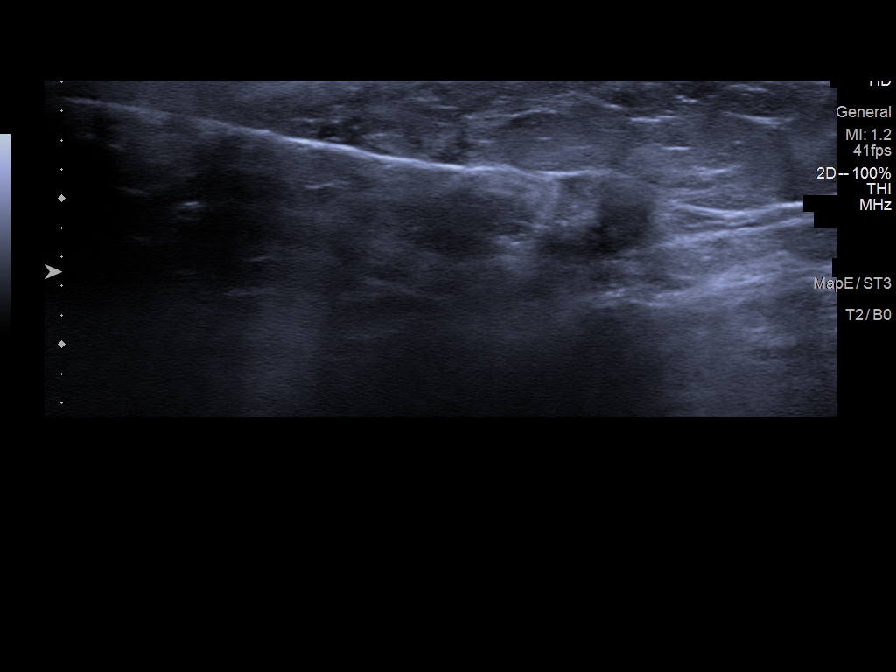
[im 4/8]
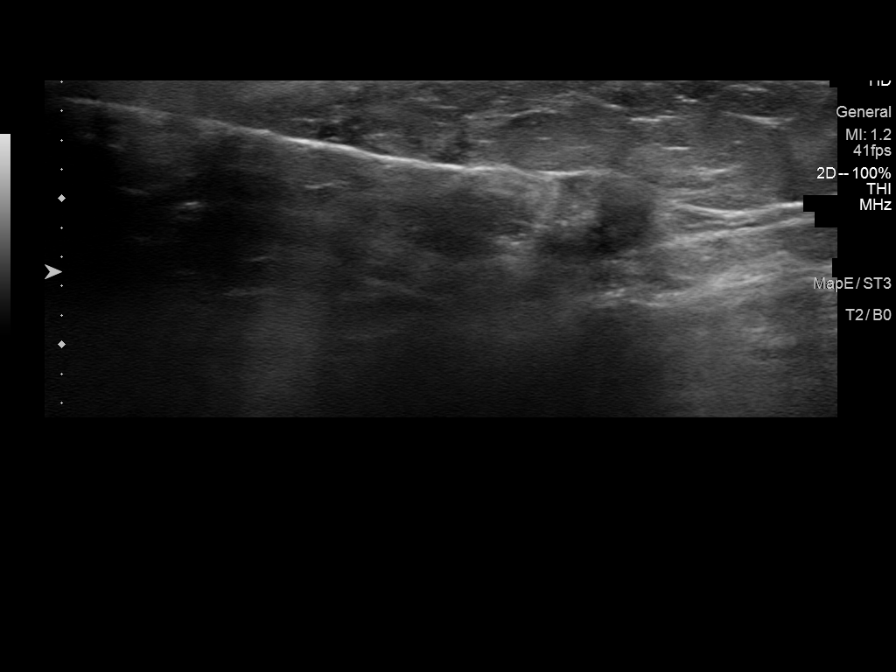
[im 5/8]
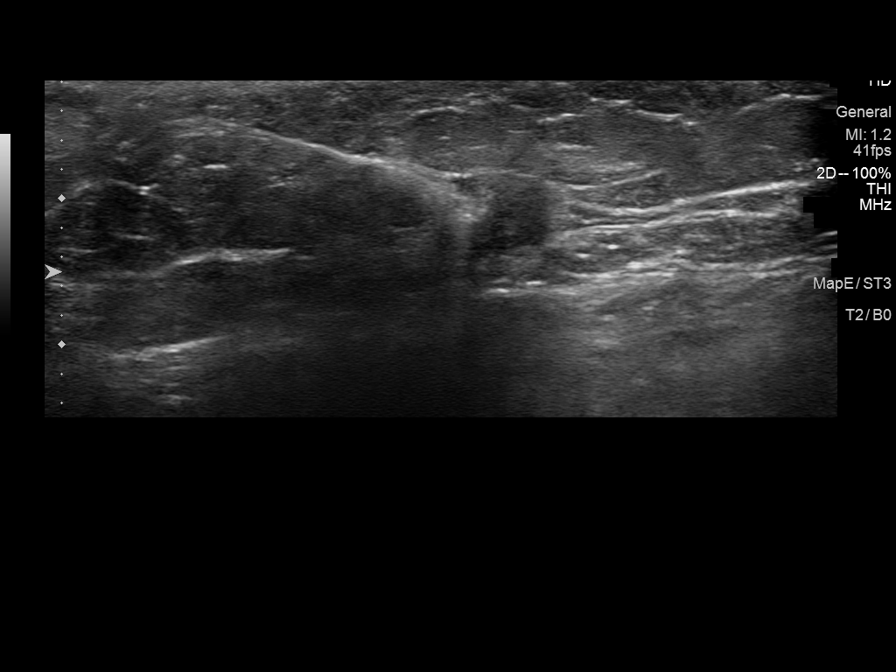
[im 6/8]
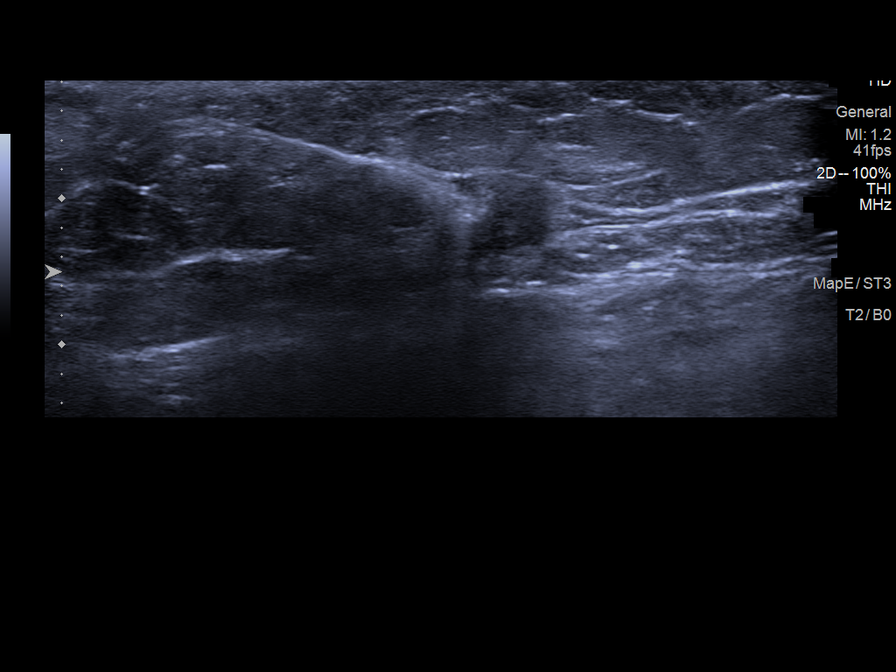
[im 7/8]
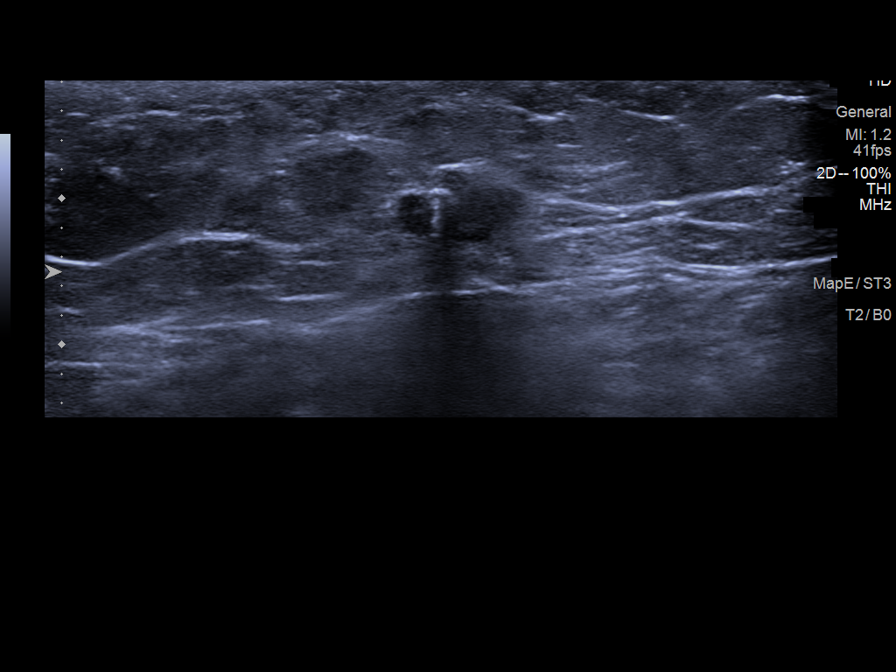
[im 8/8]
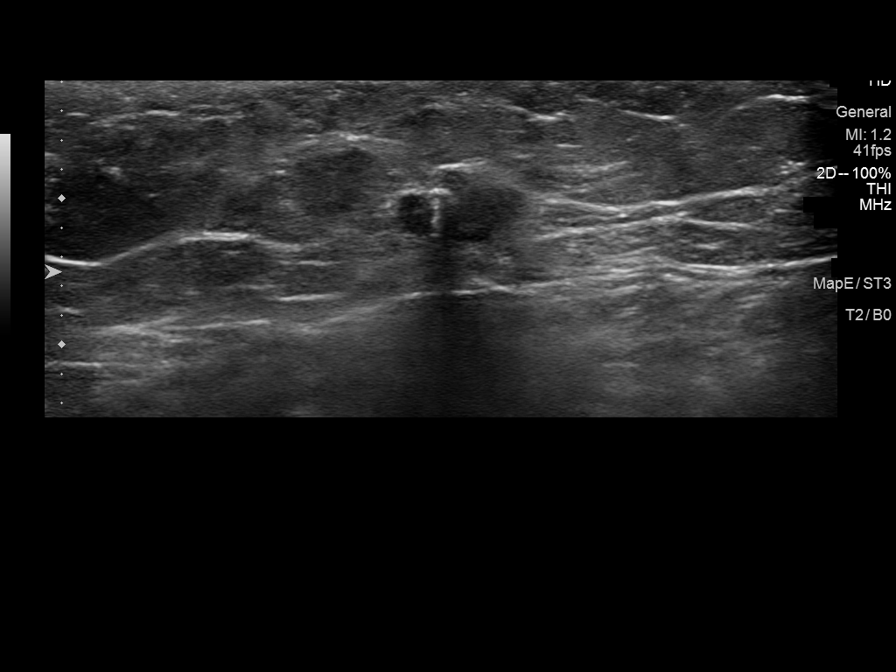

[8 of 8 positions shown; findings below may reference images not displayed]

FINDINGS: Patient presents for needle localization prior to right breast
lumpectomy. I met with the patient and we discussed the procedure
of needle localization including benefits and alternatives. We
discussed the high likelihood of a successful procedure. We
discussed the risks of the procedure, including infection, bleeding,
tissue injury, and further surgery. Informed, written consent was
given. The usual time-out protocol was performed immediately prior
to the procedure.

Using ultrasound guidance, sterile technique, 1% lidocaine and a 5
cm RF tag needle, the mass with associated biopsy marking clip was
localized using a lateral to medial approach. RF tag function was
confirmed. The images were marked for Dr. Austyn.
IMPRESSION: Radiofrequency tag localization of the right breast. No apparent
complications.

## 2021-11-30 NOTE — Progress Notes (Signed)
[facility] [address] [place] [address]   Patient Care Team: [name], MD as PCP - General (Internal Medicine) Brien Mates, RN as Oncology Nurse Navigator (Oncology) Derek Jack, MD as Medical Oncologist (Oncology)  SUMMARY OF ONCOLOGIC HISTORY: Oncology History   No history exists.    CHIEF COMPLIANT: Follow-up of right breast cancer   INTERVAL HISTORY: [name] is a 75 y.o. female here today for follow up of her right breast cancer. Her last visit was on 08/27/2021.   Today she reports feeling good. She underwent 5 days of radiation. She is taking anastrozole and tolerating it well; she denies joint pains, muscle pains, and hot flashes.   REVIEW OF SYSTEMS:   Review of Systems  Constitutional:  Negative for appetite change and fatigue.  Cardiovascular:  Positive for chest pain (3/10 breast).  Endocrine: Negative for hot flashes.  Musculoskeletal:  Negative for arthralgias and myalgias.  All other systems reviewed and are negative.  I have reviewed the past medical history, past surgical history, social history and family history with the patient and they are unchanged from previous note.   ALLERGIES:   is allergic to ancef [cefazolin] and ciprofloxacin.   MEDICATIONS:  Current Outpatient Medications  Medication Sig Dispense Refill   acetaminophen (TYLENOL) 650 MG CR tablet Take 650 mg by mouth every 8 (eight) hours as needed for pain.     ALPRAZolam (XANAX) 0.25 MG tablet Take 0.125 mg by mouth at bedtime as needed for sleep.     anastrozole (ARIMIDEX) 1 MG tablet Take 1 tablet (1 mg total) by mouth daily. 90 tablet 1   aspirin EC 81 MG tablet Take 81 mg by mouth every other day.     Biotin 1000 MCG tablet Take 1,000 mcg by mouth daily.     Calcium Carb-Cholecalciferol (CALCIUM 500/D PO) Take by mouth.     calcium carbonate (TUMS - DOSED IN MG ELEMENTAL CALCIUM) 500 MG chewable tablet Chew 500 mg by mouth daily as  needed for indigestion or heartburn.     Cyanocobalamin (B-12 SL) Place 1 mL under the tongue 3 (three) times a week.     ibuprofen (ADVIL) 200 MG tablet Take 200 mg by mouth every 6 (six) hours as needed for moderate pain.     levothyroxine (SYNTHROID, LEVOTHROID) 75 MCG tablet Take 75 mcg by mouth daily before breakfast.      lisinopril-hydrochlorothiazide (PRINZIDE,ZESTORETIC) 20-12.5 MG per tablet Take 1 tablet by mouth every morning.      lovastatin (MEVACOR) 20 MG tablet Take 20 mg by mouth at bedtime.     Magnesium 250 MG TABS Take 250 mg by mouth 2 (two) times daily.     omeprazole (PRILOSEC) 20 MG capsule Take 20 mg by mouth every morning.      No current facility-administered medications for this visit.     PHYSICAL EXAMINATION: Performance status (ECOG): 1 - Symptomatic but completely ambulatory  Vitals:   12/01/21 1417  BP: (!) 154/70  Pulse: 85  Resp: 16  Temp: (!) 97.4 F (36.3 C)  SpO2: 96%   Wt Readings from Last 3 Encounters:  12/01/21 147 lb 12.8 oz (67 kg)  08/12/21 152 lb (68.9 kg)  07/31/21 153 lb (69.4 kg)   Physical Exam Vitals reviewed.  Constitutional:      Appearance: Normal appearance.  Cardiovascular:     Rate and Rhythm: Normal rate and regular rhythm.     Pulses: Normal  pulses.     Heart sounds: Normal heart sounds.  Pulmonary:     Effort: Pulmonary effort is normal.     Breath sounds: Normal breath sounds.  Neurological:     General: No focal deficit present.     Mental Status: She is alert and oriented to person, place, and time.  Psychiatric:        Mood and Affect: Mood normal.        Behavior: Behavior normal.    Breast Exam Chaperone: Thana Ates     LABORATORY DATA:  I have reviewed the data as listed CMP Latest Ref Rng & Units 11/24/2021 07/31/2021 07/25/2012  Glucose 70 - 99 mg/dL 95 90 90  BUN 8 - 23 mg/dL 27(H) 21 23  Creatinine 0.44 - 1.00 mg/dL 0.73 0.79 0.72  Sodium 135 - 145 mmol/L 139 139 138  Potassium 3.5 - 5.1  mmol/L 4.9 3.5 4.0  Chloride 98 - 111 mmol/L 103 104 101  CO2 22 - 32 mmol/L _0 Calcium 8.9 - 10.3 mg/dL 9.7 9.3 10.0  Total Protein 6.5 - 8.1 g/dL 7.5 7.1 -  Total Bilirubin 0.3 - 1.2 mg/dL 0.6 0.7 -  Alkaline Phos 38 - 126 U/L 89 81 -  AST 15 - 41 U/L 18 20 -  ALT 0 - 44 U/L 18 21 -   No results found for: PQZ300 Lab Results  Component Value Date   WBC 9.5 11/24/2021   HGB 14.6 11/24/2021   HCT 44.5 11/24/2021   MCV 91.0 11/24/2021   PLT 249 11/24/2021   NEUTROABS 6.4 11/24/2021    ASSESSMENT:  Stage I (T1BNX) right breast IDC, 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 - Right breast lumpectomy on 08/04/2021 - Pathology consistent with 0.9 cm IDC, grade 1, free margins, PT1BPNX.  2.  Social/family history: - Lives at home with her husband.  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 - Younger sister had lung cancer and was a smoker.  Older sister had breast cancer.  Maternal uncle had esophageal cancer.   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 - She started on anastrozole on 08/27/2021. - She does not report any major hot flashes.  No musculoskeletal symptoms. - Reviewed labs which showed normal LFTs and CBC. - Recommend follow-up in 4 months with repeat labs and physical exam.  2.  Bone health: - We reviewed DEXA scan from 11/24/2021 with T score -2 consistent with osteopenia. - Vitamin D level is 33.87. - Plan to repeat DEXA scan in 2 to 3 years. - Continue calcium and vitamin D supplements and weightbearing exercises.  Breast Cancer therapy associated bone loss: I have recommended calcium, Vitamin D and weight bearing exercises.  Orders placed this encounter:  No orders of the  defined types were placed in this encounter.   The patient has a good understanding of the overall plan. She agrees with it. She will call with any problems that may develop before the next visit here.  Derek Jack, MD [place] [phone]   I, Thana Ates, am acting as a scribe for Dr. Derek Jack.  I, Derek Jack MD, have reviewed the above documentation for accuracy and completeness, and I  agree with the above.

## 2021-12-01 ENCOUNTER — Inpatient Hospital Stay (HOSPITAL_COMMUNITY): Payer: Medicare Other | Admitting: Hematology

## 2021-12-01 ENCOUNTER — Other Ambulatory Visit: Payer: Self-pay

## 2021-12-01 VITALS — BP 154/70 | HR 85 | Temp 97.4°F | Resp 16 | Ht 60.0 in | Wt 147.8 lb

## 2021-12-01 DIAGNOSIS — Z803 Family history of malignant neoplasm of breast: Secondary | ICD-10-CM | POA: Diagnosis not present

## 2021-12-01 DIAGNOSIS — C50311 Malignant neoplasm of lower-inner quadrant of right female breast: Secondary | ICD-10-CM | POA: Diagnosis not present

## 2021-12-01 DIAGNOSIS — Z79811 Long term (current) use of aromatase inhibitors: Secondary | ICD-10-CM | POA: Diagnosis not present

## 2021-12-01 DIAGNOSIS — Z801 Family history of malignant neoplasm of trachea, bronchus and lung: Secondary | ICD-10-CM | POA: Diagnosis not present

## 2021-12-01 DIAGNOSIS — Z170 Estrogen receptor positive status [ER+]: Secondary | ICD-10-CM

## 2021-12-01 DIAGNOSIS — C50911 Malignant neoplasm of unspecified site of right female breast: Secondary | ICD-10-CM | POA: Diagnosis not present

## 2021-12-01 NOTE — Patient Instructions (Signed)
[place] at [facility] Discharge Instructions  You were seen and examined today by Dr. Delton Coombes. He reviewed your most recent labs everything looks good. Continue taking Anastrozole, Calcium, and Vitamin D supplements. Please keep follow up appointment as scheduled in 4 months.   Thank you for choosing [place] at [facility] to provide your oncology and hematology care.  To afford each patient quality time with our provider, please arrive at least 15 minutes before your scheduled appointment time.   If you have a lab appointment with the [place] please come in thru the Main Entrance and check in at the main information desk.  You need to re-schedule your appointment should you arrive 10 or more minutes late.  We strive to give you quality time with our providers, and arriving late affects you and other patients whose appointments are after yours.  Also, if you no show three or more times for appointments you may be dismissed from the clinic at the providers discretion.     Again, thank you for choosing [facility].  Our hope is that these requests will decrease the amount of time that you wait before being seen by our physicians.       _____________________________________________________________  Should you have questions after your visit to [facility], please contact our office at [phone] and follow the prompts.  Our office hours are 8:00 a.m. and 4:30 p.m. Monday - Friday.  Please note that voicemails left after 4:00 p.m. may not be returned until the following business day.  We are closed weekends and major holidays.  You do have access to a nurse 24-7, just call the main number to the clinic [phone] and do not press any options, hold on the line and a nurse will answer the phone.    For prescription refill requests, have your pharmacy contact our office and allow 72 hours.    Due to Covid, you  will need to wear a mask upon entering the hospital. If you do not have a mask, a mask will be given to you at the Main Entrance upon arrival. For doctor visits, patients may have 1 support person age 48 or older with them. For treatment visits, patients can not have anyone with them due to social distancing guidelines and our immunocompromised population.

## 2021-12-01 NOTE — Progress Notes (Signed)
Patient is taking Anastrozole as prescribed.  She has not missed any doses and reports no side effects at this time.   

## 2022-01-08 DIAGNOSIS — L820 Inflamed seborrheic keratosis: Secondary | ICD-10-CM | POA: Diagnosis not present

## 2022-01-08 DIAGNOSIS — B078 Other viral warts: Secondary | ICD-10-CM | POA: Diagnosis not present

## 2022-02-18 ENCOUNTER — Other Ambulatory Visit (HOSPITAL_COMMUNITY): Payer: Self-pay | Admitting: Hematology

## 2022-03-19 DIAGNOSIS — I10 Essential (primary) hypertension: Secondary | ICD-10-CM | POA: Diagnosis not present

## 2022-03-19 DIAGNOSIS — Z853 Personal history of malignant neoplasm of breast: Secondary | ICD-10-CM | POA: Diagnosis not present

## 2022-03-25 ENCOUNTER — Inpatient Hospital Stay (HOSPITAL_COMMUNITY): Payer: Medicare Other | Attending: Hematology

## 2022-03-25 DIAGNOSIS — M8580 Other specified disorders of bone density and structure, unspecified site: Secondary | ICD-10-CM | POA: Diagnosis not present

## 2022-03-25 DIAGNOSIS — Z79899 Other long term (current) drug therapy: Secondary | ICD-10-CM | POA: Insufficient documentation

## 2022-03-25 DIAGNOSIS — C50911 Malignant neoplasm of unspecified site of right female breast: Secondary | ICD-10-CM | POA: Insufficient documentation

## 2022-03-25 DIAGNOSIS — Z170 Estrogen receptor positive status [ER+]: Secondary | ICD-10-CM | POA: Insufficient documentation

## 2022-03-25 DIAGNOSIS — Z79811 Long term (current) use of aromatase inhibitors: Secondary | ICD-10-CM | POA: Insufficient documentation

## 2022-03-25 LAB — CBC WITH DIFFERENTIAL/PLATELET
Abs Immature Granulocytes: 0.05 10*3/uL (ref 0.00–0.07)
Basophils Absolute: 0.1 10*3/uL (ref 0.0–0.1)
Basophils Relative: 1 %
Eosinophils Absolute: 0.2 10*3/uL (ref 0.0–0.5)
Eosinophils Relative: 2 %
HCT: 43.7 % (ref 36.0–46.0)
Hemoglobin: 14.1 g/dL (ref 12.0–15.0)
Immature Granulocytes: 1 %
Lymphocytes Relative: 21 %
Lymphs Abs: 2.1 10*3/uL (ref 0.7–4.0)
MCH: 28.1 pg (ref 26.0–34.0)
MCHC: 32.3 g/dL (ref 30.0–36.0)
MCV: 87.1 fL (ref 80.0–100.0)
Monocytes Absolute: 0.7 10*3/uL (ref 0.1–1.0)
Monocytes Relative: 7 %
Neutro Abs: 7.1 10*3/uL (ref 1.7–7.7)
Neutrophils Relative %: 68 %
Platelets: 146 10*3/uL — ABNORMAL LOW (ref 150–400)
RBC: 5.02 MIL/uL (ref 3.87–5.11)
RDW: 13.6 % (ref 11.5–15.5)
WBC: 10.2 10*3/uL (ref 4.0–10.5)
nRBC: 0 % (ref 0.0–0.2)

## 2022-03-25 LAB — VITAMIN D 25 HYDROXY (VIT D DEFICIENCY, FRACTURES): Vit D, 25-Hydroxy: 33.05 ng/mL (ref 30–100)

## 2022-03-25 LAB — COMPREHENSIVE METABOLIC PANEL
ALT: 17 U/L (ref 0–44)
AST: 20 U/L (ref 15–41)
Albumin: 3.9 g/dL (ref 3.5–5.0)
Alkaline Phosphatase: 89 U/L (ref 38–126)
Anion gap: 7 (ref 5–15)
BUN: 17 mg/dL (ref 8–23)
CO2: 27 mmol/L (ref 22–32)
Calcium: 9.7 mg/dL (ref 8.9–10.3)
Chloride: 105 mmol/L (ref 98–111)
Creatinine, Ser: 0.72 mg/dL (ref 0.44–1.00)
GFR, Estimated: >60 mL/min (ref >60)
Glucose, Bld: 89 mg/dL (ref 70–99)
Potassium: 3.6 mmol/L (ref 3.5–5.1)
Sodium: 139 mmol/L (ref 135–145)
Total Bilirubin: 0.4 mg/dL (ref 0.3–1.2)
Total Protein: 7.4 g/dL (ref 6.5–8.1)

## 2022-04-01 ENCOUNTER — Inpatient Hospital Stay (HOSPITAL_COMMUNITY): Payer: Medicare Other | Admitting: Hematology

## 2022-04-01 VITALS — BP 165/66 | HR 81 | Temp 98.1°F | Resp 18 | Ht 60.0 in | Wt 151.1 lb

## 2022-04-01 DIAGNOSIS — Z170 Estrogen receptor positive status [ER+]: Secondary | ICD-10-CM

## 2022-04-01 DIAGNOSIS — Z79899 Other long term (current) drug therapy: Secondary | ICD-10-CM | POA: Diagnosis not present

## 2022-04-01 DIAGNOSIS — C50311 Malignant neoplasm of lower-inner quadrant of right female breast: Secondary | ICD-10-CM

## 2022-04-01 DIAGNOSIS — C50911 Malignant neoplasm of unspecified site of right female breast: Secondary | ICD-10-CM | POA: Diagnosis not present

## 2022-04-01 DIAGNOSIS — Z79811 Long term (current) use of aromatase inhibitors: Secondary | ICD-10-CM | POA: Diagnosis not present

## 2022-04-01 DIAGNOSIS — M8580 Other specified disorders of bone density and structure, unspecified site: Secondary | ICD-10-CM | POA: Diagnosis not present

## 2022-04-01 NOTE — Patient Instructions (Signed)
[place] at [facility] ?Discharge Instructions ? ?You were seen and examined today by Dr. Delton Coombes. ? ?Dr. Delton Coombes discussed your most recent lab work and everything looks good except your platelets are slightly low. We will recheck them at your next visit. ? ?Please keep follow-up as scheduled in 4 months.  ? ? ?Thank you for choosing [place] at [facility] to provide your oncology and hematology care.  To afford each patient quality time with our provider, please arrive at least 15 minutes before your scheduled appointment time.  ? ?If you have a lab appointment with the [place] please come in thru the Main Entrance and check in at the main information desk. ? ?You need to re-schedule your appointment should you arrive 10 or more minutes late.  We strive to give you quality time with our providers, and arriving late affects you and other patients whose appointments are after yours.  Also, if you no show three or more times for appointments you may be dismissed from the clinic at the providers discretion.     ?Again, thank you for choosing [facility].  Our hope is that these requests will decrease the amount of time that you wait before being seen by our physicians.       ?_____________________________________________________________ ? ?Should you have questions after your visit to [facility], please contact our office at [phone] and follow the prompts.  Our office hours are 8:00 a.m. and 4:30 p.m. Monday - Friday.  Please note that voicemails left after 4:00 p.m. may not be returned until the following business day.  We are closed weekends and major holidays.  You do have access to a nurse 24-7, just call the main number to the clinic [phone] and do not press any options, hold on the line and a nurse will answer the phone.   ? ?For prescription refill requests, have your pharmacy contact our office and allow 72  hours.   ? ?Due to Covid, you will need to wear a mask upon entering the hospital. If you do not have a mask, a mask will be given to you at the Main Entrance upon arrival. For doctor visits, patients may have 1 support person age 39 or older with them. For treatment visits, patients can not have anyone with them due to social distancing guidelines and our immunocompromised population.  ? ?  ?

## 2022-04-01 NOTE — Progress Notes (Signed)
? ?[name] ?[address] ?[place], [place] [address] ? ? ?Patient Care Team: ?[name], MD as PCP - General (Internal Medicine) ?Brien Mates, RN as Oncology Nurse Navigator (Oncology) ?Derek Jack, MD as Medical Oncologist (Oncology) ? ?SUMMARY OF ONCOLOGIC HISTORY: ?Oncology History  ? No history exists.  ? ? ?CHIEF COMPLIANT: Follow-up of right breast cancer ? ? ?INTERVAL HISTORY: [name] is a 76 y.o. female here today for follow up of her right breast cancer. Her last visit was on 12/01/2021.  ? ?Today she reports feeling good. She is taking anastrozole and tolerating it well; she denies hot flashes and joint pains. She is taking calcium and vitamin D. She denies history of CVA and MI.  ? ?REVIEW OF SYSTEMS:   ?Review of Systems  ?Constitutional:  Negative for appetite change and fatigue.  ?Cardiovascular:  Positive for chest pain (R breast).  ?Endocrine: Negative for hot flashes.  ?Musculoskeletal:  Negative for arthralgias.  ?Psychiatric/Behavioral:  The patient is nervous/anxious.   ?All other systems reviewed and are negative. ? ?I have reviewed the past medical history, past surgical history, social history and family history with the patient and they are unchanged from previous note. ? ? ?ALLERGIES:   ?is allergic to ancef [cefazolin] and ciprofloxacin. ? ? ?MEDICATIONS:  ?Current Outpatient Medications  ?Medication Sig Dispense Refill  ? acetaminophen (TYLENOL) 650 MG CR tablet Take 650 mg by mouth every 8 (eight) hours as needed for pain.    ? ALPRAZolam (XANAX) 0.25 MG tablet Take 0.125 mg by mouth at bedtime as needed for sleep.    ? anastrozole (ARIMIDEX) 1 MG tablet TAKE 1 TABLET(1 MG) BY MOUTH DAILY 90 tablet 1  ? aspirin EC 81 MG tablet Take 81 mg by mouth every other day.    ? Biotin 1000 MCG tablet Take 1,000 mcg by mouth daily.    ? Calcium Carb-Cholecalciferol (CALCIUM 500/D PO) Take by mouth.    ? calcium carbonate (TUMS - DOSED IN MG ELEMENTAL CALCIUM)  500 MG chewable tablet Chew 500 mg by mouth daily as needed for indigestion or heartburn.    ? Cyanocobalamin (B-12 SL) Place 1 mL under the tongue 3 (three) times a week.    ? ibuprofen (ADVIL) 200 MG tablet Take 200 mg by mouth every 6 (six) hours as needed for moderate pain.    ? levothyroxine (SYNTHROID, LEVOTHROID) 75 MCG tablet Take 75 mcg by mouth daily before breakfast.     ? lisinopril-hydrochlorothiazide (PRINZIDE,ZESTORETIC) 20-12.5 MG per tablet Take 1 tablet by mouth every morning.     ? lovastatin (MEVACOR) 20 MG tablet Take 20 mg by mouth at bedtime.    ? Magnesium 250 MG TABS Take 250 mg by mouth 2 (two) times daily.    ? omeprazole (PRILOSEC) 20 MG capsule Take 20 mg by mouth every morning.     ? ?No current facility-administered medications for this visit.  ? ? ? ?PHYSICAL EXAMINATION: ?Performance status (ECOG): 1 - Symptomatic but completely ambulatory ? ?Vitals:  ? 04/01/22 1409  ?BP: (!) 165/66  ?Pulse: 81  ?Resp: 18  ?Temp: 98.1 ?F (36.7 ?C)  ?SpO2: 97%  ? ?Wt Readings from Last 3 Encounters:  ?04/01/22 151 lb 1.6 oz (68.5 kg)  ?12/01/21 147 lb 12.8 oz (67 kg)  ?08/12/21 152 lb (68.9 kg)  ? ?Physical Exam ?Vitals reviewed.  ?Constitutional:   ?   Appearance: Normal appearance.  ?Cardiovascular:  ?   Rate and Rhythm:  Normal rate and regular rhythm.  ?   Pulses: Normal pulses.  ?   Heart sounds: Normal heart sounds.  ?Pulmonary:  ?   Effort: Pulmonary effort is normal.  ?   Breath sounds: Normal breath sounds.  ?Chest:  ?Breasts: ?   Right: Tenderness present. No swelling, bleeding, inverted nipple, mass, nipple discharge or skin change (LIQ lumpectomy scar).  ?   Left: No swelling, bleeding, inverted nipple, mass, nipple discharge, skin change or tenderness.  ?Abdominal:  ?   Palpations: Abdomen is soft. There is no hepatomegaly, splenomegaly or mass.  ?   Tenderness: There is no abdominal tenderness.  ?Musculoskeletal:  ?   Right lower leg: No edema.  ?   Left lower leg: No edema.   ?Lymphadenopathy:  ?   Upper Body:  ?   Right upper body: No supraclavicular, axillary or pectoral adenopathy.  ?   Left upper body: No supraclavicular, axillary or pectoral adenopathy.  ?Neurological:  ?   General: No focal deficit present.  ?   Mental Status: She is alert and oriented to person, place, and time.  ?Psychiatric:     ?   Mood and Affect: Mood normal.     ?   Behavior: Behavior normal.  ? ? ?Breast Exam Chaperone: Thana Ates   ? ? ?LABORATORY DATA:  ?I have reviewed the data as listed ? ?  Latest Ref Rng & Units 03/25/2022  ? 12:23 PM 11/24/2021  ? 10:28 AM 07/31/2021  ?  3:12 PM  ?CMP  ?Glucose 70 - 99 mg/dL 89   95   90    ?BUN 8 - 23 mg/dL '17   27   21    ' ?Creatinine 0.44 - 1.00 mg/dL 0.72   0.73   0.79    ?Sodium 135 - 145 mmol/L 139   139   139    ?Potassium 3.5 - 5.1 mmol/L 3.6   4.9   3.5    ?Chloride 98 - 111 mmol/L 105   103   104    ?CO2 22 - 32 mmol/L '27   27   25    ' ?Calcium 8.9 - 10.3 mg/dL 9.7   9.7   9.3    ?Total Protein 6.5 - 8.1 g/dL 7.4   7.5   7.1    ?Total Bilirubin 0.3 - 1.2 mg/dL 0.4   0.6   0.7    ?Alkaline Phos 38 - 126 U/L 89   89   81    ?AST 15 - 41 U/L '20   18   20    ' ?ALT 0 - 44 U/L '17   18   21    ' ? ?No results found for: CVE938 ?Lab Results  ?Component Value Date  ? WBC 10.2 03/25/2022  ? HGB 14.1 03/25/2022  ? HCT 43.7 03/25/2022  ? MCV 87.1 03/25/2022  ? PLT 146 (L) 03/25/2022  ? NEUTROABS 7.1 03/25/2022  ? ? 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 ?- Right breast lumpectomy on 08/04/2021 ?- Pathology consistent with 0.9 cm IDC, grade 1, free margins, PT1BPNX. ?- XRT to the right breast in 5 fractions completed. ?- Anastrozole started on 08/27/2021. ? ?2.  Social/family history: ?- Lives at home with her husband.  She worked as a Occupational psychologist prior to retirement.  Never  smoker. ?- Younger sister had lung cancer and was a smoker.  Older sister had breast cancer.  Maternal uncle had esophageal cancer. ? ? 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  No major side effects reported. ?- Physical examination shows lumpectomy scar in the lower inner quadrant of the right breast, slightly tender.  No palpable masses or adenopathy. ?- Continue anastrozole daily.  RTC 4 months with repeat labs.  We will schedule her for mammogram after 06/26/2022.  We will switch her to 47-monthvisits after that. ? ?2.  Osteopenia: ?- Continue calcium and vitamin D supplements and weightbearing exercises. ?- Vitamin D is 33.05. ?- Plan to repeat DEXA scan in 2 to 3 years. ? ?Breast Cancer therapy associated bone loss: I have recommended calcium, Vitamin D and weight bearing exercises. ? ?Orders placed this encounter:  ?No orders of the defined types were placed in this encounter. ? ? ?The patient has a good understanding of the overall plan. She agrees with it. She will call with any problems that may develop before the next visit here. ? ?SDerek Jack MD ?[place]?[phone]? ? ?I, KThana Ates am acting as a scribe for Dr. SDerek Jack ? ?I, SDerek JackMD, have reviewed the above documentation for accuracy and completeness, and I agree with the above. ?  ? ? ?

## 2022-06-02 ENCOUNTER — Other Ambulatory Visit (HOSPITAL_COMMUNITY): Payer: Self-pay | Admitting: Hematology

## 2022-06-02 DIAGNOSIS — N6310 Unspecified lump in the right breast, unspecified quadrant: Secondary | ICD-10-CM

## 2022-06-30 ENCOUNTER — Encounter (HOSPITAL_COMMUNITY): Payer: Self-pay

## 2022-06-30 ENCOUNTER — Ambulatory Visit (HOSPITAL_COMMUNITY)
Admission: RE | Admit: 2022-06-30 | Discharge: 2022-06-30 | Disposition: A | Discharge status: Home / Self Care | Payer: Medicare Other | Source: Ambulatory Visit | Attending: Hematology | Admitting: Hematology

## 2022-06-30 DIAGNOSIS — C50311 Malignant neoplasm of lower-inner quadrant of right female breast: Secondary | ICD-10-CM | POA: Diagnosis not present

## 2022-06-30 DIAGNOSIS — Z853 Personal history of malignant neoplasm of breast: Secondary | ICD-10-CM | POA: Diagnosis not present

## 2022-06-30 DIAGNOSIS — N6310 Unspecified lump in the right breast, unspecified quadrant: Secondary | ICD-10-CM

## 2022-06-30 DIAGNOSIS — Z170 Estrogen receptor positive status [ER+]: Secondary | ICD-10-CM | POA: Insufficient documentation

## 2022-07-30 ENCOUNTER — Inpatient Hospital Stay: Payer: Medicare Other | Attending: Hematology

## 2022-07-30 DIAGNOSIS — Z79899 Other long term (current) drug therapy: Secondary | ICD-10-CM | POA: Diagnosis not present

## 2022-07-30 DIAGNOSIS — Z170 Estrogen receptor positive status [ER+]: Secondary | ICD-10-CM | POA: Insufficient documentation

## 2022-07-30 DIAGNOSIS — Z79811 Long term (current) use of aromatase inhibitors: Secondary | ICD-10-CM | POA: Diagnosis not present

## 2022-07-30 DIAGNOSIS — C50311 Malignant neoplasm of lower-inner quadrant of right female breast: Secondary | ICD-10-CM | POA: Insufficient documentation

## 2022-07-30 DIAGNOSIS — M8580 Other specified disorders of bone density and structure, unspecified site: Secondary | ICD-10-CM | POA: Insufficient documentation

## 2022-07-30 LAB — COMPREHENSIVE METABOLIC PANEL
ALT: 21 U/L (ref 0–44)
AST: 20 U/L (ref 15–41)
Albumin: 3.9 g/dL (ref 3.5–5.0)
Alkaline Phosphatase: 83 U/L (ref 38–126)
Anion gap: 6 (ref 5–15)
BUN: 27 mg/dL — ABNORMAL HIGH (ref 8–23)
CO2: 28 mmol/L (ref 22–32)
Calcium: 9.6 mg/dL (ref 8.9–10.3)
Chloride: 107 mmol/L (ref 98–111)
Creatinine, Ser: 0.76 mg/dL (ref 0.44–1.00)
GFR, Estimated: >60 mL/min (ref >60)
Glucose, Bld: 106 mg/dL — ABNORMAL HIGH (ref 70–99)
Potassium: 4.1 mmol/L (ref 3.5–5.1)
Sodium: 141 mmol/L (ref 135–145)
Total Bilirubin: 0.4 mg/dL (ref 0.3–1.2)
Total Protein: 7.1 g/dL (ref 6.5–8.1)

## 2022-07-30 LAB — CBC WITH DIFFERENTIAL/PLATELET
Abs Immature Granulocytes: 0.05 10*3/uL (ref 0.00–0.07)
Basophils Absolute: 0.1 10*3/uL (ref 0.0–0.1)
Basophils Relative: 1 %
Eosinophils Absolute: 0.2 10*3/uL (ref 0.0–0.5)
Eosinophils Relative: 2 %
HCT: 43.4 % (ref 36.0–46.0)
Hemoglobin: 14.2 g/dL (ref 12.0–15.0)
Immature Granulocytes: 0 %
Lymphocytes Relative: 23 %
Lymphs Abs: 2.8 10*3/uL (ref 0.7–4.0)
MCH: 28.6 pg (ref 26.0–34.0)
MCHC: 32.7 g/dL (ref 30.0–36.0)
MCV: 87.3 fL (ref 80.0–100.0)
Monocytes Absolute: 0.8 10*3/uL (ref 0.1–1.0)
Monocytes Relative: 6 %
Neutro Abs: 8.2 10*3/uL — ABNORMAL HIGH (ref 1.7–7.7)
Neutrophils Relative %: 68 %
Platelets: 262 10*3/uL (ref 150–400)
RBC: 4.97 MIL/uL (ref 3.87–5.11)
RDW: 13.7 % (ref 11.5–15.5)
WBC: 12.2 10*3/uL — ABNORMAL HIGH (ref 4.0–10.5)
nRBC: 0 % (ref 0.0–0.2)

## 2022-08-06 ENCOUNTER — Inpatient Hospital Stay (HOSPITAL_BASED_OUTPATIENT_CLINIC_OR_DEPARTMENT_OTHER): Payer: Medicare Other | Admitting: Hematology

## 2022-08-06 VITALS — BP 154/80 | HR 101 | Temp 97.9°F | Resp 18 | Ht 60.0 in | Wt 157.4 lb

## 2022-08-06 DIAGNOSIS — Z79811 Long term (current) use of aromatase inhibitors: Secondary | ICD-10-CM | POA: Diagnosis not present

## 2022-08-06 DIAGNOSIS — M8580 Other specified disorders of bone density and structure, unspecified site: Secondary | ICD-10-CM | POA: Diagnosis not present

## 2022-08-06 DIAGNOSIS — C50311 Malignant neoplasm of lower-inner quadrant of right female breast: Secondary | ICD-10-CM | POA: Diagnosis not present

## 2022-08-06 DIAGNOSIS — Z170 Estrogen receptor positive status [ER+]: Secondary | ICD-10-CM | POA: Diagnosis not present

## 2022-08-06 DIAGNOSIS — Z79899 Other long term (current) drug therapy: Secondary | ICD-10-CM | POA: Diagnosis not present

## 2022-08-06 NOTE — Patient Instructions (Signed)
[name]  Discharge Instructions  You were seen and examined today by Dr. Delton Coombes.  Dr. Delton Coombes discussed your most recent lab work and everything looks good except your bone density test shows osteopenia.   Continue Anastrozole, Calcium and Vitamin D as prescribed.  Follow-up as scheduled in 6 months.    Thank you for choosing [facility] to provide your oncology and hematology care.   To afford each patient quality time with our provider, please arrive at least 15 minutes before your scheduled appointment time. You may need to reschedule your appointment if you arrive late (10 or more minutes). Arriving late affects you and other patients whose appointments are after yours.  Also, if you miss three or more appointments without notifying the office, you may be dismissed from the clinic at the provider's discretion.    Again, thank you for choosing [facility].  Our hope is that these requests will decrease the amount of time that you wait before being seen by our physicians.   If you have a lab appointment with the [facility] please come in thru the Main Entrance and check in at the main information desk.           _____________________________________________________________  Should you have questions after your visit to [facility], please contact our office at [phone] and follow the prompts.  Our office hours are 8:00 a.m. to 4:30 p.m. Monday - Thursday and 8:00 a.m. to 2:30 p.m. Friday.  Please note that voicemails left after 4:00 p.m. may not be returned until the following business day.  We are closed weekends and all major holidays.  You do have access to a nurse 24-7, just call the main number to the clinic [phone] and do not press any options, hold on the line and a nurse will answer the phone.    For prescription refill requests, have your pharmacy contact our office and allow  72 hours.

## 2022-08-06 NOTE — Progress Notes (Signed)
[place] [address] [place] [address]   Patient Care Team: [name], MD as PCP - General (Internal Medicine) Brien Mates, RN as Oncology Nurse Navigator (Oncology) Derek Jack, MD as Medical Oncologist (Oncology)  SUMMARY OF ONCOLOGIC HISTORY: Oncology History   No history exists.    CHIEF COMPLIANT: Follow-up of right breast cancer   INTERVAL HISTORY: [name] is a 76 y.o. female here for follow-up of right breast cancer.  She is tolerating anastrozole very well.  She reports pains in the hand joints and difficulty making fist with some stiffness for the last 2 months.  Denies any major hot flashes.  REVIEW OF SYSTEMS:   Review of Systems  Constitutional:  Negative for appetite change and fatigue.  Cardiovascular:  Negative for chest pain.  Endocrine: Negative for hot flashes.  Musculoskeletal:  Negative for arthralgias.  Neurological:  Positive for numbness.  Psychiatric/Behavioral:  The patient is not nervous/anxious.   All other systems reviewed and are negative.   I have reviewed the past medical history, past surgical history, social history and family history with the patient and they are unchanged from previous note.   ALLERGIES:   is allergic to ancef [cefazolin] and ciprofloxacin.   MEDICATIONS:  Current Outpatient Medications  Medication Sig Dispense Refill   acetaminophen (TYLENOL) 650 MG CR tablet Take 650 mg by mouth every 8 (eight) hours as needed for pain.     ALPRAZolam (XANAX) 0.25 MG tablet Take 0.125 mg by mouth at bedtime as needed for sleep.     anastrozole (ARIMIDEX) 1 MG tablet TAKE 1 TABLET(1 MG) BY MOUTH DAILY 90 tablet 1   aspirin EC 81 MG tablet Take 81 mg by mouth every other day.     Biotin 1000 MCG tablet Take 1,000 mcg by mouth daily.     Calcium Carb-Cholecalciferol (CALCIUM 500/D PO) Take by mouth.     calcium carbonate (TUMS - DOSED IN MG ELEMENTAL CALCIUM) 500 MG chewable tablet  Chew 500 mg by mouth daily as needed for indigestion or heartburn.     Cyanocobalamin (B-12 SL) Place 1 mL under the tongue 3 (three) times a week.     ibuprofen (ADVIL) 200 MG tablet Take 200 mg by mouth every 6 (six) hours as needed for moderate pain.     levothyroxine (SYNTHROID, LEVOTHROID) 75 MCG tablet Take 75 mcg by mouth daily before breakfast.      lisinopril-hydrochlorothiazide (PRINZIDE,ZESTORETIC) 20-12.5 MG per tablet Take 1 tablet by mouth every morning.      lovastatin (MEVACOR) 20 MG tablet Take 20 mg by mouth at bedtime.     Magnesium 250 MG TABS Take 250 mg by mouth 2 (two) times daily.     omeprazole (PRILOSEC) 20 MG capsule Take 20 mg by mouth every morning.      No current facility-administered medications for this visit.     PHYSICAL EXAMINATION: Performance status (ECOG): 1 - Symptomatic but completely ambulatory  Vitals:   08/06/22 1448  BP: (!) 154/80  Pulse: (!) 101  Resp: 18  Temp: 97.9 F (36.6 C)  SpO2: 94%   Wt Readings from Last 3 Encounters:  08/06/22 157 lb 6.4 oz (71.4 kg)  04/01/22 151 lb 1.6 oz (68.5 kg)  12/01/21 147 lb 12.8 oz (67 kg)   Physical Exam Vitals reviewed.  Constitutional:      Appearance: Normal appearance.  Cardiovascular:     Rate and Rhythm: Normal rate and regular  rhythm.     Pulses: Normal pulses.     Heart sounds: Normal heart sounds.  Pulmonary:     Effort: Pulmonary effort is normal.     Breath sounds: Normal breath sounds.  Chest:  Breasts:    Right: Tenderness present. No swelling, bleeding, inverted nipple, mass, nipple discharge or skin change (LIQ lumpectomy scar).     Left: No swelling, bleeding, inverted nipple, mass, nipple discharge, skin change or tenderness.  Abdominal:     Palpations: Abdomen is soft. There is no hepatomegaly, splenomegaly or mass.     Tenderness: There is no abdominal tenderness.  Musculoskeletal:     Right lower leg: No edema.     Left lower leg: No edema.  Lymphadenopathy:      Upper Body:     Right upper body: No supraclavicular, axillary or pectoral adenopathy.     Left upper body: No supraclavicular, axillary or pectoral adenopathy.  Neurological:     General: No focal deficit present.     Mental Status: She is alert and oriented to person, place, and time.  Psychiatric:        Mood and Affect: Mood normal.        Behavior: Behavior normal.     Breast Exam Chaperone: Thana Ates     LABORATORY DATA:  I have reviewed the data as listed    Latest Ref Rng & Units 07/30/2022    2:02 PM 03/25/2022   12:23 PM 11/24/2021   10:28 AM  CMP  Glucose 70 - 99 mg/dL 106  89  95   BUN 8 - 23 mg/dL '27  17  27   ' Creatinine 0.44 - 1.00 mg/dL 0.76  0.72  0.73   Sodium 135 - 145 mmol/L 141  139  139   Potassium 3.5 - 5.1 mmol/L 4.1  3.6  4.9   Chloride 98 - 111 mmol/L 107  105  103   CO2 22 - 32 mmol/L '28  27  27   ' Calcium 8.9 - 10.3 mg/dL 9.6  9.7  9.7   Total Protein 6.5 - 8.1 g/dL 7.1  7.4  7.5   Total Bilirubin 0.3 - 1.2 mg/dL 0.4  0.4  0.6   Alkaline Phos 38 - 126 U/L 83  89  89   AST 15 - 41 U/L '20  20  18   ' ALT 0 - 44 U/L '21  17  18    ' No results found for: "CAN153" Lab Results  Component Value Date   WBC 12.2 (H) 07/30/2022   HGB 14.2 07/30/2022   HCT 43.4 07/30/2022   MCV 87.3 07/30/2022   PLT 262 07/30/2022   NEUTROABS 8.2 (H) 07/30/2022    ASSESSMENT:  Stage I (T1BNX) right breast IDC, 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 - Right breast lumpectomy on 08/04/2021 - Pathology consistent with 0.9 cm IDC, grade 1, free margins, PT1BPNX. - XRT to the right breast in 5 fractions completed. - Anastrozole started on 08/27/2021.  2.  Social/family history: - Lives at home with her husband.  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 - Younger sister had lung cancer and was a  smoker.  Older sister had breast cancer.  Maternal uncle had esophageal cancer.   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  This has always been tender.  No masses palpable or lymphadenopathy. - Reviewed labs today which showed normal LFTs.  CBC was grossly normal except mildly elevated neutrophils. - Mammogram on 06/30/2022 was BI-RADS Category 2.  Continue anastrozole daily.  RTC 6 months for follow-up with repeat labs and exam.  2.  Osteopenia: - DEXA scan on 11/24/2021 with T score -2.0.       - Continue calcium and vitamin D supplements.  Breast Cancer therapy associated bone loss: I have recommended calcium, Vitamin D and weight bearing exercises.  Orders placed this encounter:  Orders Placed This Encounter  Procedures   CBC with Differential/Platelet   Comprehensive metabolic panel   VITAMIN D 25 Hydroxy (Vit-D Deficiency, Fractures)     The patient has a good understanding of the overall plan. She agrees with it. She will call with any problems that may develop before the next visit here.  Derek Jack, MD [place] [phone]

## 2022-08-21 ENCOUNTER — Other Ambulatory Visit (HOSPITAL_COMMUNITY): Payer: Self-pay | Admitting: Hematology

## 2022-09-18 DIAGNOSIS — C50911 Malignant neoplasm of unspecified site of right female breast: Secondary | ICD-10-CM | POA: Diagnosis not present

## 2022-09-18 DIAGNOSIS — E785 Hyperlipidemia, unspecified: Secondary | ICD-10-CM | POA: Diagnosis not present

## 2022-09-18 DIAGNOSIS — K219 Gastro-esophageal reflux disease without esophagitis: Secondary | ICD-10-CM | POA: Diagnosis not present

## 2022-09-18 DIAGNOSIS — E039 Hypothyroidism, unspecified: Secondary | ICD-10-CM | POA: Diagnosis not present

## 2022-09-18 DIAGNOSIS — I10 Essential (primary) hypertension: Secondary | ICD-10-CM | POA: Diagnosis not present

## 2022-09-24 DIAGNOSIS — E785 Hyperlipidemia, unspecified: Secondary | ICD-10-CM | POA: Diagnosis not present

## 2022-09-24 DIAGNOSIS — E039 Hypothyroidism, unspecified: Secondary | ICD-10-CM | POA: Diagnosis not present

## 2022-09-24 DIAGNOSIS — Z853 Personal history of malignant neoplasm of breast: Secondary | ICD-10-CM | POA: Diagnosis not present

## 2022-09-24 DIAGNOSIS — R000 Tachycardia, unspecified: Secondary | ICD-10-CM | POA: Diagnosis not present

## 2022-09-24 DIAGNOSIS — I10 Essential (primary) hypertension: Secondary | ICD-10-CM | POA: Diagnosis not present

## 2022-10-28 DIAGNOSIS — Z170 Estrogen receptor positive status [ER+]: Secondary | ICD-10-CM | POA: Diagnosis not present

## 2022-10-28 DIAGNOSIS — C50311 Malignant neoplasm of lower-inner quadrant of right female breast: Secondary | ICD-10-CM | POA: Diagnosis not present

## 2022-11-02 DIAGNOSIS — H43393 Other vitreous opacities, bilateral: Secondary | ICD-10-CM | POA: Diagnosis not present

## 2023-01-28 ENCOUNTER — Inpatient Hospital Stay: Payer: Medicare Other | Attending: Hematology

## 2023-01-28 DIAGNOSIS — Z803 Family history of malignant neoplasm of breast: Secondary | ICD-10-CM | POA: Insufficient documentation

## 2023-01-28 DIAGNOSIS — Z801 Family history of malignant neoplasm of trachea, bronchus and lung: Secondary | ICD-10-CM | POA: Insufficient documentation

## 2023-01-28 DIAGNOSIS — Z923 Personal history of irradiation: Secondary | ICD-10-CM | POA: Diagnosis not present

## 2023-01-28 DIAGNOSIS — Z170 Estrogen receptor positive status [ER+]: Secondary | ICD-10-CM | POA: Insufficient documentation

## 2023-01-28 DIAGNOSIS — C50311 Malignant neoplasm of lower-inner quadrant of right female breast: Secondary | ICD-10-CM | POA: Insufficient documentation

## 2023-01-28 DIAGNOSIS — M8580 Other specified disorders of bone density and structure, unspecified site: Secondary | ICD-10-CM | POA: Insufficient documentation

## 2023-01-28 DIAGNOSIS — Z79811 Long term (current) use of aromatase inhibitors: Secondary | ICD-10-CM | POA: Diagnosis not present

## 2023-01-28 LAB — COMPREHENSIVE METABOLIC PANEL
ALT: 22 U/L (ref 0–44)
AST: 24 U/L (ref 15–41)
Albumin: 3.8 g/dL (ref 3.5–5.0)
Alkaline Phosphatase: 85 U/L (ref 38–126)
Anion gap: 10 (ref 5–15)
BUN: 22 mg/dL (ref 8–23)
CO2: 26 mmol/L (ref 22–32)
Calcium: 9.2 mg/dL (ref 8.9–10.3)
Chloride: 101 mmol/L (ref 98–111)
Creatinine, Ser: 0.76 mg/dL (ref 0.44–1.00)
GFR, Estimated: >60 mL/min (ref >60)
Glucose, Bld: 95 mg/dL (ref 70–99)
Potassium: 4.5 mmol/L (ref 3.5–5.1)
Sodium: 137 mmol/L (ref 135–145)
Total Bilirubin: 0.6 mg/dL (ref 0.3–1.2)
Total Protein: 7.3 g/dL (ref 6.5–8.1)

## 2023-01-28 LAB — CBC WITH DIFFERENTIAL/PLATELET
Abs Immature Granulocytes: 0.08 10*3/uL — ABNORMAL HIGH (ref 0.00–0.07)
Basophils Absolute: 0.1 10*3/uL (ref 0.0–0.1)
Basophils Relative: 1 %
Eosinophils Absolute: 0.3 10*3/uL (ref 0.0–0.5)
Eosinophils Relative: 2 %
HCT: 42.7 % (ref 36.0–46.0)
Hemoglobin: 13.7 g/dL (ref 12.0–15.0)
Immature Granulocytes: 1 %
Lymphocytes Relative: 22 %
Lymphs Abs: 2.5 10*3/uL (ref 0.7–4.0)
MCH: 28.8 pg (ref 26.0–34.0)
MCHC: 32.1 g/dL (ref 30.0–36.0)
MCV: 89.7 fL (ref 80.0–100.0)
Monocytes Absolute: 0.7 10*3/uL (ref 0.1–1.0)
Monocytes Relative: 6 %
Neutro Abs: 7.9 10*3/uL — ABNORMAL HIGH (ref 1.7–7.7)
Neutrophils Relative %: 68 %
Platelets: 265 10*3/uL (ref 150–400)
RBC: 4.76 MIL/uL (ref 3.87–5.11)
RDW: 14 % (ref 11.5–15.5)
WBC: 11.5 10*3/uL — ABNORMAL HIGH (ref 4.0–10.5)
nRBC: 0 % (ref 0.0–0.2)

## 2023-01-30 LAB — MISC LABCORP TEST (SEND OUT): Labcorp test code: 81950

## 2023-02-04 ENCOUNTER — Inpatient Hospital Stay: Payer: Medicare Other | Admitting: Hematology

## 2023-02-04 ENCOUNTER — Other Ambulatory Visit (HOSPITAL_COMMUNITY): Payer: Self-pay | Admitting: Hematology

## 2023-02-04 VITALS — BP 138/60 | HR 90 | Temp 97.7°F | Resp 18 | Ht 60.0 in | Wt 159.3 lb

## 2023-02-04 DIAGNOSIS — Z803 Family history of malignant neoplasm of breast: Secondary | ICD-10-CM | POA: Diagnosis not present

## 2023-02-04 DIAGNOSIS — Z853 Personal history of malignant neoplasm of breast: Secondary | ICD-10-CM

## 2023-02-04 DIAGNOSIS — Z923 Personal history of irradiation: Secondary | ICD-10-CM | POA: Diagnosis not present

## 2023-02-04 DIAGNOSIS — Z801 Family history of malignant neoplasm of trachea, bronchus and lung: Secondary | ICD-10-CM | POA: Diagnosis not present

## 2023-02-04 DIAGNOSIS — M8580 Other specified disorders of bone density and structure, unspecified site: Secondary | ICD-10-CM | POA: Diagnosis not present

## 2023-02-04 DIAGNOSIS — Z79811 Long term (current) use of aromatase inhibitors: Secondary | ICD-10-CM | POA: Diagnosis not present

## 2023-02-04 DIAGNOSIS — Z170 Estrogen receptor positive status [ER+]: Secondary | ICD-10-CM | POA: Diagnosis not present

## 2023-02-04 DIAGNOSIS — C50311 Malignant neoplasm of lower-inner quadrant of right female breast: Secondary | ICD-10-CM

## 2023-02-04 NOTE — Progress Notes (Signed)
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 - Right breast lumpectomy on 08/04/2021 - Pathology consistent with 0.9 cm IDC, grade 1, free margins, PT1BPNX. - XRT to the right breast in 5 fractions completed. - Anastrozole started on 08/27/2021.  2.  Social/family history: - Lives at home with her husband.  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 - Younger sister had lung cancer and was a smoker.  Older sister had breast cancer.  Maternal uncle had esophageal cancer.    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 - She is tolerating anastrozole very well. - Reviewed labs today which showed normal LFTs.  CBC shows mild leukocytosis with neutrophils for the last 2 times. - Recommend follow-up in 6 months.  Will arrange for mammogram after July 18.  2.  Osteopenia (DEXA 11/24/2021 T-score -2): - Vitamin D level is 34.  Continue calcium 1200 mg and vitamin D 1000 units daily.  Plan to repeat DEXA scan in 2 years.  Orders Placed This Encounter  Procedures   MM DIAG BREAST TOMO BILATERAL    Standing Status:   Future    Standing Expiration Date:   02/05/2024    Order Specific Question:   Reason for Exam (SYMPTOM  OR DIAGNOSIS REQUIRED)    Answer:   personal hx of breast cancer    Order Specific Question:   Preferred imaging location?    Answer:   [facility]    Order Specific Question:   Release to patient    Answer:   Immediate   CBC with Differential/Platelet    Standing Status:   Future    Standing Expiration Date:   02/05/2024    Order Specific Question:   Release to patient    Answer:   Immediate   Comprehensive metabolic panel    Standing Status:   Future    Standing Expiration Date:   02/05/2024    Order Specific Question:   Release to patient    Answer:   Immediate   VITAMIN D 25 Hydroxy (Vit-D Deficiency, Fractures)    Standing Status:   Future    Standing Expiration Date:   02/05/2024    Order Specific Question:   Release to patient    Answer:   Immediate      [name] as a scribe for Derek Jack, MD.,have documented all relevant documentation on the behalf of Derek Jack, MD,as directed by  Derek Jack, MD while in the presence of Derek Jack, MD.   I, Derek Jack MD, have reviewed the above documentation for accuracy and completeness, and I agree with the above.   Doyce Loose   2/22/20242:57 PM  CHIEF COMPLAINT:   Diagnosis: right breast cancer     Cancer Staging  Malignant neoplasm of lower-inner quadrant of right female breast [facility]) Staging form: Breast, AJCC 8th Edition - Clinical stage  from 08/26/2021: Stage IA (cT1b, cN0, cM0, G1, ER+, PR+, HER2-) - Unsigned    Prior Therapy: Right breast lumpectomy and radiation therapy  Current Therapy: Anastrozole   HISTORY OF PRESENT ILLNESS:   Oncology History   No history exists.     INTERVAL HISTORY:   [name] is a 77 y.o. female presenting to clinic today for follow up of right breast cancer. She was last seen by me on 08/06/2022.  Today, she states that she is doing well overall. Her appetite level is at 100%. Her energy level is at 80%.She states that she is still tender in her chest area and she rates this feeling a 4/10. She denies hot flashes but states she is always hot. She also  continues to have problem with her hands. She continues to take calcium and VitaminD once a day.    PAST MEDICAL HISTORY:   Past Medical History: Past Medical History:  Diagnosis Date   Acid reflux    Breast cancer, right breast (HCC)    HTN (hypertension)    Hypothyroidism    Thyroid disease     Surgical History: Past Surgical History:  Procedure Laterality Date   BLADDER SURGERY     BREAST LUMPECTOMY WITH RADIOFREQUENCY TAG IDENTIFICATION Right 08/04/2021   Procedure: BREAST LUMPECTOMY WITH RADIOFREQUENCY TAG IDENTIFICATION;  Surgeon: Aviva Signs, MD;  Location: [facility];  Service: General;  Laterality: Right;   CHOLECYSTECTOMY     COLONOSCOPY N/A 12/27/2013   Procedure: COLONOSCOPY;  Surgeon: Rogene Houston, MD;  Location: [facility] END[facility] SU[facility];  Service: Endoscopy;  Laterality: N/A;  1030   ESOPHAGOGASTRODUODENOSCOPY (EGD) WITH ESOPHAGEAL DILATION N/A 04/20/2013   Procedure: ESOPHAGOGASTRODUODENOSCOPY (EGD) WITH ESOPHAGEAL DILATION;  Surgeon: Rogene Houston, MD;  Location: [facility];  Service: Endoscopy;  Laterality: N/A;  300-moved to 100 Ann to notify pt   MASTECTOMY, PARTIAL Right 08/04/2021   Procedure: MASTECTOMY PARTIAL;  Surgeon: Aviva Signs, MD;  Location: [facility] OR[facility];  Service: General;  Laterality: Right;   PARTIAL HYSTERECTOMY     TUBAL LIGATION     UMBILICAL HERNIA REPAIR      Social History: Social History   Socioeconomic History   Marital status: Married    Spouse name: Not on file   Number of children: Not on file   Years of education: 12   Highest education level: Not on file  Occupational History   Not on file  Tobacco Use   Smoking status: Never   Smokeless tobacco: Never  Vaping Use   Vaping Use: Never used  Substance and Sexual Activity   Alcohol use: No   Drug use: No   Sexual activity: Not Currently  Other Topics Concern   Not on file  Social History Narrative   Not on file   Social Determinants of Health   Financial Resource Strain: Low  Risk  (08/27/2021)   Overall Financial Resource Strain (CARDIA)    Difficulty of Paying Living Expenses: Not hard at all  Food Insecurity: No Food Insecurity (08/27/2021)   Hunger Vital Sign    Worried About Running Out of Food in the Last Year: Never true    Ran Out of Food in the Last Year: Never true  Transportation Needs: No Transportation Needs (08/27/2021)   PRAPARE - Hydrologist (Medical): No    Lack of Transportation (Non-Medical): No  Physical Activity: Sufficiently Active (08/27/2021)   Exercise Vital Sign    Days of Exercise per Week: 7 days  Minutes of Exercise per Session: 30 min  Stress: No Stress Concern Present (08/27/2021)   [place]    Feeling of Stress : Only a little  Social Connections: Moderately Integrated (08/27/2021)   Social Connection and Isolation Panel [NHANES]    Frequency of Communication with Friends and Family: More than three times a week    Frequency of Social Gatherings with Friends and Family: More than three times a week    Attends Religious Services: More than 4 times per year    Active Member of [employer] or Organizations: No    Attends Archivist Meetings: Never    Marital Status: Married  Human resources officer Violence: Not At Risk (08/27/2021)   Humiliation, Afraid, Rape, and Kick questionnaire    Fear of Current or Ex-Partner: No    Emotionally Abused: No    Physically Abused: No    Sexually Abused: No    Family History: Family History  Problem Relation Age of Onset   Heart disease Mother    Emphysema Father    Breast cancer Sister    Heart disease Sister    Lung cancer Sister    Heart disease Sister    Heart disease Brother    Heart disease Brother     Current Medications:  Current Outpatient Medications:    acetaminophen (TYLENOL) 650 MG CR tablet, Take 650 mg by mouth every 8 (eight) hours as needed for pain., Disp: , Rfl:     ALPRAZolam (XANAX) 0.25 MG tablet, Take 0.125 mg by mouth at bedtime as needed for sleep., Disp: , Rfl:    anastrozole (ARIMIDEX) 1 MG tablet, TAKE 1 TABLET(1 MG) BY MOUTH DAILY, Disp: 90 tablet, Rfl: 1   aspirin EC 81 MG tablet, Take 81 mg by mouth every other day., Disp: , Rfl:    Biotin 1000 MCG tablet, Take 1,000 mcg by mouth daily., Disp: , Rfl:    Calcium Carb-Cholecalciferol (CALCIUM 500/D PO), Take by mouth., Disp: , Rfl:    calcium carbonate (TUMS - DOSED IN MG ELEMENTAL CALCIUM) 500 MG chewable tablet, Chew 500 mg by mouth daily as needed for indigestion or heartburn., Disp: , Rfl:    Cyanocobalamin (B-12 SL), Place 1 mL under the tongue 3 (three) times a week., Disp: , Rfl:    ibuprofen (ADVIL) 200 MG tablet, Take 200 mg by mouth every 6 (six) hours as needed for moderate pain., Disp: , Rfl:    levothyroxine (SYNTHROID, LEVOTHROID) 75 MCG tablet, Take 75 mcg by mouth daily before breakfast. , Disp: , Rfl:    lisinopril-hydrochlorothiazide (PRINZIDE,ZESTORETIC) 20-12.5 MG per tablet, Take 1 tablet by mouth every morning. , Disp: , Rfl:    lovastatin (MEVACOR) 20 MG tablet, Take 20 mg by mouth at bedtime., Disp: , Rfl:    Magnesium 250 MG TABS, Take 250 mg by mouth 2 (two) times daily., Disp: , Rfl:    omeprazole (PRILOSEC) 20 MG capsule, Take 20 mg by mouth every morning. , Disp: , Rfl:    Allergies: Allergies  Allergen Reactions   Ancef [Cefazolin] Anaphylaxis   Ciprofloxacin Anaphylaxis    REVIEW OF SYSTEMS:   Review of Systems  Constitutional:  Negative for chills, fatigue and fever.  HENT:   Negative for lump/mass, mouth sores, nosebleeds, sore throat and trouble swallowing.   Eyes:  Negative for eye problems.  Respiratory:  Negative for cough and shortness of breath.   Cardiovascular:  Negative for chest pain, leg swelling  and palpitations.  Gastrointestinal:  Negative for abdominal pain, constipation, diarrhea, nausea and vomiting.  Genitourinary:  Negative for bladder  incontinence, difficulty urinating, dysuria, frequency, hematuria and nocturia.   Musculoskeletal:  Negative for arthralgias, back pain, flank pain, myalgias and neck pain.  Skin:  Negative for itching and rash.  Neurological:  Negative for dizziness, headaches and numbness.  Hematological:  Does not bruise/bleed easily.  Psychiatric/Behavioral:  Negative for depression, sleep disturbance and suicidal ideas. The patient is not nervous/anxious.   All other systems reviewed and are negative.    VITALS:   Blood pressure 138/60, pulse 90, temperature 97.7 F (36.5 C), temperature source Oral, resp. rate 18, height 5' (1.524 m), weight 159 lb 4.8 oz (72.3 kg), SpO2 97 %.  Wt Readings from Last 3 Encounters:  02/04/23 159 lb 4.8 oz (72.3 kg)  08/06/22 157 lb 6.4 oz (71.4 kg)  04/01/22 151 lb 1.6 oz (68.5 kg)    Body mass index is 31.11 kg/m.  Performance status (ECOG): 1 - Symptomatic but completely ambulatory  PHYSICAL EXAM:   Physical Exam Vitals and nursing note reviewed. Exam conducted with a chaperone present.  Constitutional:      Appearance: Normal appearance.  Cardiovascular:     Rate and Rhythm: Normal rate and regular rhythm.     Pulses: Normal pulses.     Heart sounds: Normal heart sounds.  Pulmonary:     Effort: Pulmonary effort is normal.     Breath sounds: Normal breath sounds.  Abdominal:     Palpations: Abdomen is soft. There is no hepatomegaly, splenomegaly or mass.     Tenderness: There is no abdominal tenderness.  Musculoskeletal:     Right lower leg: No edema.     Left lower leg: No edema.  Lymphadenopathy:     Cervical: No cervical adenopathy.     Right cervical: No superficial, deep or posterior cervical adenopathy.    Left cervical: No superficial, deep or posterior cervical adenopathy.     Upper Body:     Right upper body: No supraclavicular or axillary adenopathy.     Left upper body: No supraclavicular or axillary adenopathy.  Neurological:      General: No focal deficit present.     Mental Status: She is alert and oriented to person, place, and time.  Psychiatric:        Mood and Affect: Mood normal.        Behavior: Behavior normal.     LABS:      Latest Ref Rng & Units 01/28/2023    2:30 PM 07/30/2022    2:02 PM 03/25/2022   12:23 PM  CBC  WBC 4.0 - 10.5 K/uL 11.5  12.2  10.2   Hemoglobin 12.0 - 15.0 g/dL 13.7  14.2  14.1   Hematocrit 36.0 - 46.0 % 42.7  43.4  43.7   Platelets 150 - 400 K/uL 265  262  146       Latest Ref Rng & Units 01/28/2023    2:30 PM 07/30/2022    2:02 PM 03/25/2022   12:23 PM  CMP  Glucose 70 - 99 mg/dL 95  106  89   BUN 8 - 23 mg/dL 22  27  17   $ Creatinine 0.44 - 1.00 mg/dL 0.76  0.76  0.72   Sodium 135 - 145 mmol/L 137  141  139   Potassium 3.5 - 5.1 mmol/L 4.5  4.1  3.6   Chloride 98 - 111 mmol/L 101  107  105   CO2 22 - 32 mmol/L 26  28  27   $ Calcium 8.9 - 10.3 mg/dL 9.2  9.6  9.7   Total Protein 6.5 - 8.1 g/dL 7.3  7.1  7.4   Total Bilirubin 0.3 - 1.2 mg/dL 0.6  0.4  0.4   Alkaline Phos 38 - 126 U/L 85  83  89   AST 15 - 41 U/L 24  20  20   $ ALT 0 - 44 U/L 22  21  17      $ No results found for: "CEA1", "CEA" / No results found for: "CEA1", "CEA" No results found for: "PSA1" No results found for: "[id number]" No results found for: "CAN125"  No results found for: "TOTALPROTELP", "ALBUMINELP", "A1GS", "A2GS", "BETS", "BETA2SER", "GAMS", "MSPIKE", "SPEI" No results found for: "TIBC", "FERRITIN", "IRONPCTSAT" No results found for: "LDH"   STUDIES:   No results found.

## 2023-02-04 NOTE — Patient Instructions (Signed)
[facility]  Discharge Instructions  You were seen and examined today by Dr. Delton Coombes.  Dr. Delton Coombes discussed your most recent lab work which revealed that everything looks good.  Dr. Delton Coombes is ordering a mammogram to be done after 07/01/23. Continue taking Anastrozole, Vitamin D and Calcium as prescribed.  Follow-up as scheduled in 6 months with labs.    Thank you for choosing [facility] to provide your oncology and hematology care.   To afford each patient quality time with our provider, please arrive at least 15 minutes before your scheduled appointment time. You may need to reschedule your appointment if you arrive late (10 or more minutes). Arriving late affects you and other patients whose appointments are after yours.  Also, if you miss three or more appointments without notifying the office, you may be dismissed from the clinic at the provider's discretion.    Again, thank you for choosing [facility].  Our hope is that these requests will decrease the amount of time that you wait before being seen by our physicians.   If you have a lab appointment with the [facility] please come in thru the Main Entrance and check in at the main information desk.           _____________________________________________________________  Should you have questions after your visit to [facility], please contact our office at [phone] and follow the prompts.  Our office hours are 8:00 a.m. to 4:30 p.m. Monday - Thursday and 8:00 a.m. to 2:30 p.m. Friday.  Please note that voicemails left after 4:00 p.m. may not be returned until the following business day.  We are closed weekends and all major holidays.  You do have access to a nurse 24-7, just call the main number to the clinic [phone] and do not press any options, hold on the line and a nurse will answer the phone.    For prescription refill  requests, have your pharmacy contact our office and allow 72 hours.    Masks are optional in the cancer centers. If you would like for your care team to wear a mask while they are taking care of you, please let them know. You may have one support person who is at least 79 years old accompany you for your appointments.

## 2023-02-11 DIAGNOSIS — D225 Melanocytic nevi of trunk: Secondary | ICD-10-CM | POA: Diagnosis not present

## 2023-02-11 DIAGNOSIS — L821 Other seborrheic keratosis: Secondary | ICD-10-CM | POA: Diagnosis not present

## 2023-02-15 ENCOUNTER — Other Ambulatory Visit: Payer: Self-pay | Admitting: Hematology

## 2023-03-25 DIAGNOSIS — Z853 Personal history of malignant neoplasm of breast: Secondary | ICD-10-CM | POA: Diagnosis not present

## 2023-03-25 DIAGNOSIS — I10 Essential (primary) hypertension: Secondary | ICD-10-CM | POA: Diagnosis not present

## 2023-07-06 ENCOUNTER — Ambulatory Visit (HOSPITAL_COMMUNITY): Payer: Medicare Other

## 2023-07-06 ENCOUNTER — Ambulatory Visit (HOSPITAL_COMMUNITY)
Admission: RE | Admit: 2023-07-06 | Discharge: 2023-07-06 | Disposition: A | Discharge status: Home / Self Care | Payer: Medicare Other | Source: Ambulatory Visit | Attending: Hematology | Admitting: Hematology

## 2023-07-06 ENCOUNTER — Encounter (HOSPITAL_COMMUNITY): Payer: Self-pay

## 2023-07-06 ENCOUNTER — Other Ambulatory Visit: Payer: Medicare Other

## 2023-07-06 DIAGNOSIS — Z853 Personal history of malignant neoplasm of breast: Secondary | ICD-10-CM

## 2023-07-06 DIAGNOSIS — R928 Other abnormal and inconclusive findings on diagnostic imaging of breast: Secondary | ICD-10-CM | POA: Diagnosis not present

## 2023-07-06 DIAGNOSIS — C50311 Malignant neoplasm of lower-inner quadrant of right female breast: Secondary | ICD-10-CM | POA: Diagnosis not present

## 2023-07-06 DIAGNOSIS — Z170 Estrogen receptor positive status [ER+]: Secondary | ICD-10-CM | POA: Diagnosis not present

## 2023-08-05 ENCOUNTER — Inpatient Hospital Stay: Payer: Medicare Other | Attending: Hematology

## 2023-08-05 DIAGNOSIS — C50311 Malignant neoplasm of lower-inner quadrant of right female breast: Secondary | ICD-10-CM | POA: Insufficient documentation

## 2023-08-05 LAB — CBC WITH DIFFERENTIAL/PLATELET
Abs Immature Granulocytes: 0.05 10*3/uL (ref 0.00–0.07)
Basophils Absolute: 0.1 10*3/uL (ref 0.0–0.1)
Basophils Relative: 1 %
Eosinophils Absolute: 0.3 10*3/uL (ref 0.0–0.5)
Eosinophils Relative: 2 %
HCT: 44.3 % (ref 36.0–46.0)
Hemoglobin: 14.2 g/dL (ref 12.0–15.0)
Immature Granulocytes: 0 %
Lymphocytes Relative: 22 %
Lymphs Abs: 2.4 10*3/uL (ref 0.7–4.0)
MCH: 28.6 pg (ref 26.0–34.0)
MCHC: 32.1 g/dL (ref 30.0–36.0)
MCV: 89.3 fL (ref 80.0–100.0)
Monocytes Absolute: 0.7 10*3/uL (ref 0.1–1.0)
Monocytes Relative: 6 %
Neutro Abs: 7.7 10*3/uL (ref 1.7–7.7)
Neutrophils Relative %: 69 %
Platelets: 294 10*3/uL (ref 150–400)
RBC: 4.96 MIL/uL (ref 3.87–5.11)
RDW: 13.6 % (ref 11.5–15.5)
WBC: 11.2 10*3/uL — ABNORMAL HIGH (ref 4.0–10.5)
nRBC: 0 % (ref 0.0–0.2)

## 2023-08-05 LAB — COMPREHENSIVE METABOLIC PANEL
ALT: 20 U/L (ref 0–44)
AST: 19 U/L (ref 15–41)
Albumin: 3.9 g/dL (ref 3.5–5.0)
Alkaline Phosphatase: 82 U/L (ref 38–126)
Anion gap: 8 (ref 5–15)
BUN: 18 mg/dL (ref 8–23)
CO2: 28 mmol/L (ref 22–32)
Calcium: 9.5 mg/dL (ref 8.9–10.3)
Chloride: 103 mmol/L (ref 98–111)
Creatinine, Ser: 0.92 mg/dL (ref 0.44–1.00)
GFR, Estimated: >60 mL/min (ref >60)
Glucose, Bld: 106 mg/dL — ABNORMAL HIGH (ref 70–99)
Potassium: 4.7 mmol/L (ref 3.5–5.1)
Sodium: 139 mmol/L (ref 135–145)
Total Bilirubin: 0.6 mg/dL (ref 0.3–1.2)
Total Protein: 7.2 g/dL (ref 6.5–8.1)

## 2023-08-05 LAB — VITAMIN D 25 HYDROXY (VIT D DEFICIENCY, FRACTURES): Vit D, 25-Hydroxy: 37.97 ng/mL (ref 30–100)

## 2023-08-11 ENCOUNTER — Other Ambulatory Visit: Payer: Self-pay | Admitting: Hematology

## 2023-08-11 ENCOUNTER — Other Ambulatory Visit: Payer: Self-pay | Admitting: *Deleted

## 2023-08-11 NOTE — Telephone Encounter (Signed)
Anastrozole refill approved.  Patient is tolerating and is to continue therapy.

## 2023-08-12 ENCOUNTER — Inpatient Hospital Stay: Payer: Medicare Other | Admitting: Hematology

## 2023-08-12 VITALS — BP 127/44 | HR 85 | Temp 97.8°F | Resp 18 | Wt 150.8 lb

## 2023-08-12 DIAGNOSIS — Z170 Estrogen receptor positive status [ER+]: Secondary | ICD-10-CM | POA: Diagnosis not present

## 2023-08-12 DIAGNOSIS — C50311 Malignant neoplasm of lower-inner quadrant of right female breast: Secondary | ICD-10-CM | POA: Diagnosis not present

## 2023-08-12 NOTE — Progress Notes (Signed)
Tolerating Anastrozole without difficulty

## 2023-08-12 NOTE — Progress Notes (Signed)
[facility] 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 - Right breast lumpectomy on 08/04/2021 - Pathology consistent with 0.9 cm IDC, grade 1, free margins, PT1BPNX. - XRT to the right breast in 5 fractions completed. - Anastrozole started on 08/27/2021.  2.  Social/family history: - Lives at home with her husband.  She worked as a Associate Professor prior to retirement.  Never smoker. - Younger sister had lung cancer and was a smoker.  Older sister had breast cancer.  Maternal uncle had esophageal cancer.    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 - Reviewed mammogram from 07/06/2023: No evidence of recurrence. - Labs from 08/05/2023: Normal LFTs.  CBC grossly normal. - RTC 6 months for follow-up with repeat labs and exam.  2.  Osteopenia (DEXA 11/24/2021 T-score -2): - Continue vitamin D 1000 units daily.  Vitamin D level is 37. - Recommend DEXA scan prior to next visit.  Orders Placed This Encounter  Procedures   DG Bone Density    Standing Status:   Future    Standing Expiration Date:   08/11/2024    Order Specific Question:   Reason for Exam (SYMPTOM  OR DIAGNOSIS REQUIRED)    Answer:   personal hx of breast cancer    Order Specific Question:   Preferred imaging location?    Answer:   [facility]    Order Specific Question:   Release to patient    Answer:   Immediate    CBC with Differential/Platelet    Standing Status:   Future    Standing Expiration Date:   08/11/2024    Order Specific Question:   Release to patient    Answer:   Immediate   Comprehensive metabolic panel    Standing Status:   Future    Standing Expiration Date:   08/11/2024    Order Specific Question:   Release to patient    Answer:   Immediate     I,Helena R Teague,acting as a scribe for Doreatha Massed, MD.,have documented all relevant documentation on the behalf of Doreatha Massed, MD,as directed by  Doreatha Massed, MD while in the presence of Doreatha Massed, MD.  I, Doreatha Massed MD, have reviewed the above documentation for accuracy and completeness, and I agree with the above.    Doreatha Massed, MD   9/17/20246:45 PM  CHIEF COMPLAINT:   Diagnosis: right breast cancer     Cancer Staging  Malignant neoplasm of lower-inner quadrant of right female breast [facility]) Staging form: Breast, AJCC 8th Edition - Clinical stage from 08/26/2021: Stage IA (cT1b, cN0, cM0, G1, ER+, PR+, HER2-) - Unsigned    Prior Therapy: Right breast lumpectomy and radiation therapy  Current Therapy: Anastrozole   HISTORY OF PRESENT ILLNESS:   Oncology History   No history exists.     INTERVAL HISTORY:   Weldon is a 77 y.o. female presenting to clinic today for follow  up of right breast cancer. She was last seen by me on 02/04/23.  Since her last visit, she underwent a bilateral MM on 07/06/23 that found: stable lumpectomy changes in the right breast and no mammographic evidence of malignancy in either breast.  Today, she states that she is doing well overall. Her appetite level is at 100%. Her energy level is at 75%. She is accompanied by her husband.  She reports hot flashes, attributed to Anastrozole treatment. She also notes lower back pain and pain in the joints of her bilateral hands that have worsened gradually with age.    PAST MEDICAL HISTORY:   Past  Medical History: Past Medical History:  Diagnosis Date   Acid reflux    Breast cancer, right breast (HCC) 06/2021   Invasive ductal carcinoma, grade 1   HTN (hypertension)    Hypothyroidism    Thyroid disease     Surgical History: Past Surgical History:  Procedure Laterality Date   BLADDER SURGERY     BREAST LUMPECTOMY WITH RADIOFREQUENCY TAG IDENTIFICATION Right 08/04/2021   Procedure: BREAST LUMPECTOMY WITH RADIOFREQUENCY TAG IDENTIFICATION;  Surgeon: Franky Macho, MD;  Location: [facility];  Service: General;  Laterality: Right;   CHOLECYSTECTOMY     COLONOSCOPY N/A 12/27/2013   Procedure: COLONOSCOPY;  Surgeon: Malissa Hippo, MD;  Location: [facility];  Service: Endoscopy;  Laterality: N/A;  1030   ESOPHAGOGASTRODUODENOSCOPY (EGD) WITH ESOPHAGEAL DILATION N/A 04/20/2013   Procedure: ESOPHAGOGASTRODUODENOSCOPY (EGD) WITH ESOPHAGEAL DILATION;  Surgeon: Malissa Hippo, MD;  Location: [facility];  Service: Endoscopy;  Laterality: N/A;  300-moved to 100 Ann to notify pt   MASTECTOMY, PARTIAL Right 08/04/2021   Invasive ductal carcinoma, grade 1/Procedure: MASTECTOMY PARTIAL;  Surgeon: Franky Macho, MD;  Location: [facility];  Service: General;  Laterality: Right;   PARTIAL HYSTERECTOMY     TUBAL LIGATION     UMBILICAL HERNIA REPAIR      Social History: Social History   Socioeconomic History   Marital status: Married    Spouse name: Not on file   Number of children: Not on file   Years of education: 12   Highest education level: Not on file  Occupational History   Not on file  Tobacco Use   Smoking status: Never   Smokeless tobacco: Never  Vaping Use   Vaping status: Never Used  Substance and Sexual Activity   Alcohol use: No   Drug use: No   Sexual activity: Not Currently  Other Topics Concern   Not on file  Social History Narrative   Not on file   Social Determinants of Health   Financial Resource Strain: Low Risk  (08/27/2021)   Overall Financial Resource  Strain (CARDIA)    Difficulty of Paying Living Expenses: Not hard at all  Food Insecurity: No Food Insecurity (08/27/2021)   Hunger Vital Sign    Worried About Running Out of Food in the Last Year: Never true    Ran Out of Food in the Last Year: Never true  Transportation Needs: No Transportation Needs (08/27/2021)   PRAPARE - Administrator, Civil Service (Medical): No    Lack of Transportation (Non-Medical): No  Physical Activity: Sufficiently Active (08/27/2021)   Exercise Vital Sign    Days of Exercise per Week: 7 days    Minutes of Exercise per Session: 30 min  Stress: No Stress Concern Present (08/27/2021)   [employer] of Occupational Health - Occupational Stress Questionnaire    Feeling  of Stress : Only a little  Social Connections: Moderately Integrated (08/27/2021)   Social Connection and Isolation Panel [NHANES]    Frequency of Communication with Friends and Family: More than three times a week    Frequency of Social Gatherings with Friends and Family: More than three times a week    Attends Religious Services: More than 4 times per year    Active Member of [employer] or Organizations: No    Attends Banker Meetings: Never    Marital Status: Married  Catering manager Violence: Not At Risk (08/27/2021)   Humiliation, Afraid, Rape, and Kick questionnaire    Fear of Current or Ex-Partner: No    Emotionally Abused: No    Physically Abused: No    Sexually Abused: No    Family History: Family History  Problem Relation Age of Onset   Heart disease Mother    Emphysema Father    Breast cancer Sister    Heart disease Sister    Lung cancer Sister    Heart disease Sister    Heart disease Brother    Heart disease Brother     Current Medications:  Current Outpatient Medications:    acetaminophen (TYLENOL) 650 MG CR tablet, Take 650 mg by mouth every 8 (eight) hours as needed for pain., Disp: , Rfl:    ALPRAZolam (XANAX) 0.25 MG tablet, Take 0.125 mg  by mouth at bedtime as needed for sleep., Disp: , Rfl:    anastrozole (ARIMIDEX) 1 MG tablet, TAKE 1 TABLET(1 MG) BY MOUTH DAILY, Disp: 90 tablet, Rfl: 1   aspirin EC 81 MG tablet, Take 81 mg by mouth every other day., Disp: , Rfl:    Biotin 1000 MCG tablet, Take 1,000 mcg by mouth daily., Disp: , Rfl:    Calcium Carb-Cholecalciferol (CALCIUM 500/D PO), Take by mouth., Disp: , Rfl:    calcium carbonate (TUMS - DOSED IN MG ELEMENTAL CALCIUM) 500 MG chewable tablet, Chew 500 mg by mouth daily as needed for indigestion or heartburn., Disp: , Rfl:    Cyanocobalamin (B-12 SL), Place 1 mL under the tongue 3 (three) times a week., Disp: , Rfl:    ibuprofen (ADVIL) 200 MG tablet, Take 200 mg by mouth every 6 (six) hours as needed for moderate pain., Disp: , Rfl:    levothyroxine (SYNTHROID, LEVOTHROID) 75 MCG tablet, Take 75 mcg by mouth daily before breakfast. , Disp: , Rfl:    lisinopril-hydrochlorothiazide (PRINZIDE,ZESTORETIC) 20-12.5 MG per tablet, Take 1 tablet by mouth every morning. , Disp: , Rfl:    lovastatin (MEVACOR) 20 MG tablet, Take 20 mg by mouth at bedtime., Disp: , Rfl:    Magnesium 250 MG TABS, Take 250 mg by mouth 2 (two) times daily., Disp: , Rfl:    omeprazole (PRILOSEC) 20 MG capsule, Take 20 mg by mouth every morning. , Disp: , Rfl:    Allergies: Allergies  Allergen Reactions   Ancef [Cefazolin] Anaphylaxis   Ciprofloxacin Anaphylaxis    REVIEW OF SYSTEMS:   Review of Systems  Constitutional:  Negative for chills, fatigue and fever.  HENT:   Negative for lump/mass, mouth sores, nosebleeds, sore throat and trouble swallowing.   Eyes:  Negative for eye problems.  Respiratory:  Negative for cough and shortness of breath.   Cardiovascular:  Negative for chest pain, leg swelling and palpitations.  Gastrointestinal:  Negative for abdominal pain, constipation, diarrhea, nausea and vomiting.  Endocrine: Positive for hot flashes.  Genitourinary:  Negative for bladder  incontinence, difficulty  urinating, dysuria, frequency, hematuria and nocturia.   Musculoskeletal:  Positive for back pain (lower). Negative for arthralgias, flank pain, myalgias and neck pain.  Skin:  Negative for itching and rash.  Neurological:  Positive for headaches. Negative for dizziness and numbness.  Hematological:  Does not bruise/bleed easily.  Psychiatric/Behavioral:  Negative for depression, sleep disturbance and suicidal ideas. The patient is not nervous/anxious.   All other systems reviewed and are negative.    VITALS:   Blood pressure (!) 127/44, pulse 85, temperature 97.8 F (36.6 C), temperature source Oral, resp. rate 18, weight 150 lb 12.8 oz (68.4 kg), SpO2 96%.  Wt Readings from Last 3 Encounters:  08/12/23 150 lb 12.8 oz (68.4 kg)  02/04/23 159 lb 4.8 oz (72.3 kg)  08/06/22 157 lb 6.4 oz (71.4 kg)    Body mass index is 29.45 kg/m.  Performance status (ECOG): 1 - Symptomatic but completely ambulatory  PHYSICAL EXAM:   Physical Exam Vitals and nursing note reviewed. Exam conducted with a chaperone present.  Constitutional:      Appearance: Normal appearance.  Cardiovascular:     Rate and Rhythm: Normal rate and regular rhythm.     Pulses: Normal pulses.     Heart sounds: Normal heart sounds.  Pulmonary:     Effort: Pulmonary effort is normal.     Breath sounds: Normal breath sounds.  Abdominal:     Palpations: Abdomen is soft. There is no hepatomegaly, splenomegaly or mass.     Tenderness: There is no abdominal tenderness.  Musculoskeletal:     Right lower leg: No edema.     Left lower leg: No edema.  Lymphadenopathy:     Cervical: No cervical adenopathy.     Right cervical: No superficial, deep or posterior cervical adenopathy.    Left cervical: No superficial, deep or posterior cervical adenopathy.     Upper Body:     Right upper body: No supraclavicular or axillary adenopathy.     Left upper body: No supraclavicular or axillary adenopathy.   Neurological:     General: No focal deficit present.     Mental Status: She is alert and oriented to person, place, and time.  Psychiatric:        Mood and Affect: Mood normal.        Behavior: Behavior normal.     LABS:      Latest Ref Rng & Units 08/05/2023    1:49 PM 01/28/2023    2:30 PM 07/30/2022    2:02 PM  CBC  WBC 4.0 - 10.5 K/uL 11.2  11.5  12.2   Hemoglobin 12.0 - 15.0 g/dL 01.0  93.2  35.5   Hematocrit 36.0 - 46.0 % 44.3  42.7  43.4   Platelets 150 - 400 K/uL 294  265  262       Latest Ref Rng & Units 08/05/2023    1:49 PM 01/28/2023    2:30 PM 07/30/2022    2:02 PM  CMP  Glucose 70 - 99 mg/dL 732  95  202   BUN 8 - 23 mg/dL 18  22  27    Creatinine 0.44 - 1.00 mg/dL 5.42  7.06  2.37   Sodium 135 - 145 mmol/L 139  137  141   Potassium 3.5 - 5.1 mmol/L 4.7  4.5  4.1   Chloride 98 - 111 mmol/L 103  101  107   CO2 22 - 32 mmol/L 28  26  28    Calcium 8.9 -  10.3 mg/dL 9.5  9.2  9.6   Total Protein 6.5 - 8.1 g/dL 7.2  7.3  7.1   Total Bilirubin 0.3 - 1.2 mg/dL 0.6  0.6  0.4   Alkaline Phos 38 - 126 U/L 82  85  83   AST 15 - 41 U/L 19  24  20    ALT 0 - 44 U/L 20  22  21       No results found for: "CEA1", "CEA" / No results found for: "CEA1", "CEA" No results found for: "PSA1" No results found for: "ONG295" No results found for: "CAN125"  No results found for: "TOTALPROTELP", "ALBUMINELP", "A1GS", "A2GS", "BETS", "BETA2SER", "GAMS", "MSPIKE", "SPEI" No results found for: "TIBC", "FERRITIN", "IRONPCTSAT" No results found for: "LDH"   STUDIES:   No results found.

## 2023-08-12 NOTE — Patient Instructions (Signed)
 [facility]  Discharge Instructions  You were seen and examined today by Dr. Ellin Saba.  Dr. Ellin Saba discussed your most recent lab work which revealed that everything looks good.  Continue taking the Anastrozole, Vitamin D and Calcium as prescribed.  Follow-up as scheduled in 6 months.    Thank you for choosing [facility] to provide your oncology and hematology care.   To afford each patient quality time with our provider, please arrive at least 15 minutes before your scheduled appointment time. You may need to reschedule your appointment if you arrive late (10 or more minutes). Arriving late affects you and other patients whose appointments are after yours.  Also, if you miss three or more appointments without notifying the office, you may be dismissed from the clinic at the provider's discretion.    Again, thank you for choosing [facility].  Our hope is that these requests will decrease the amount of time that you wait before being seen by our physicians.   If you have a lab appointment with the Cancer Center - please note that after April 8th, all labs will be drawn in the cancer center.  You do not have to check in or register with the main entrance as you have in the past but will complete your check-in at the cancer center.            _____________________________________________________________  Should you have questions after your visit to [facility], please contact our office at [phone] and follow the prompts.  Our office hours are 8:00 a.m. to 4:30 p.m. Monday - Thursday and 8:00 a.m. to 2:30 p.m. Friday.  Please note that voicemails left after 4:00 p.m. may not be returned until the following business day.  We are closed weekends and all major holidays.  You do have access to a nurse 24-7, just call the main number to the clinic [phone] and do not press any options, hold on the line  and a nurse will answer the phone.    For prescription refill requests, have your pharmacy contact our office and allow 72 hours.    Masks are no longer required in the cancer centers. If you would like for your care team to wear a mask while they are taking care of you, please let them know. You may have one support person who is at least 79 years old accompany you for your appointments.

## 2023-09-21 DIAGNOSIS — E785 Hyperlipidemia, unspecified: Secondary | ICD-10-CM | POA: Diagnosis not present

## 2023-09-21 DIAGNOSIS — C50919 Malignant neoplasm of unspecified site of unspecified female breast: Secondary | ICD-10-CM | POA: Diagnosis not present

## 2023-09-21 DIAGNOSIS — M1990 Unspecified osteoarthritis, unspecified site: Secondary | ICD-10-CM | POA: Diagnosis not present

## 2023-09-21 DIAGNOSIS — I10 Essential (primary) hypertension: Secondary | ICD-10-CM | POA: Diagnosis not present

## 2023-09-21 DIAGNOSIS — E039 Hypothyroidism, unspecified: Secondary | ICD-10-CM | POA: Diagnosis not present

## 2023-09-28 DIAGNOSIS — K219 Gastro-esophageal reflux disease without esophagitis: Secondary | ICD-10-CM | POA: Diagnosis not present

## 2023-09-28 DIAGNOSIS — I10 Essential (primary) hypertension: Secondary | ICD-10-CM | POA: Diagnosis not present

## 2023-09-28 DIAGNOSIS — E785 Hyperlipidemia, unspecified: Secondary | ICD-10-CM | POA: Diagnosis not present

## 2023-09-28 DIAGNOSIS — I471 Supraventricular tachycardia, unspecified: Secondary | ICD-10-CM | POA: Diagnosis not present

## 2023-09-28 DIAGNOSIS — E039 Hypothyroidism, unspecified: Secondary | ICD-10-CM | POA: Diagnosis not present

## 2023-09-28 DIAGNOSIS — Z0001 Encounter for general adult medical examination with abnormal findings: Secondary | ICD-10-CM | POA: Diagnosis not present

## 2023-09-28 DIAGNOSIS — Z853 Personal history of malignant neoplasm of breast: Secondary | ICD-10-CM | POA: Diagnosis not present

## 2023-11-08 DIAGNOSIS — H43393 Other vitreous opacities, bilateral: Secondary | ICD-10-CM | POA: Diagnosis not present

## 2023-11-29 ENCOUNTER — Other Ambulatory Visit (HOSPITAL_COMMUNITY): Payer: Medicare Other

## 2023-12-01 ENCOUNTER — Other Ambulatory Visit (HOSPITAL_COMMUNITY): Payer: Medicare Other

## 2023-12-01 ENCOUNTER — Encounter (INDEPENDENT_AMBULATORY_CARE_PROVIDER_SITE_OTHER): Payer: Self-pay | Admitting: *Deleted

## 2023-12-10 ENCOUNTER — Ambulatory Visit (HOSPITAL_COMMUNITY)
Admission: RE | Admit: 2023-12-10 | Discharge: 2023-12-10 | Disposition: A | Discharge status: Home / Self Care | Payer: Medicare Other | Source: Ambulatory Visit | Attending: Hematology | Admitting: Hematology

## 2023-12-10 DIAGNOSIS — C50311 Malignant neoplasm of lower-inner quadrant of right female breast: Secondary | ICD-10-CM | POA: Diagnosis not present

## 2023-12-10 DIAGNOSIS — M8589 Other specified disorders of bone density and structure, multiple sites: Secondary | ICD-10-CM | POA: Insufficient documentation

## 2023-12-10 DIAGNOSIS — M85832 Other specified disorders of bone density and structure, left forearm: Secondary | ICD-10-CM | POA: Diagnosis not present

## 2023-12-10 DIAGNOSIS — Z170 Estrogen receptor positive status [ER+]: Secondary | ICD-10-CM | POA: Diagnosis not present

## 2023-12-10 DIAGNOSIS — Z780 Asymptomatic menopausal state: Secondary | ICD-10-CM | POA: Diagnosis not present

## 2024-02-01 ENCOUNTER — Inpatient Hospital Stay: Payer: Medicare Other | Attending: Hematology

## 2024-02-01 DIAGNOSIS — Z170 Estrogen receptor positive status [ER+]: Secondary | ICD-10-CM | POA: Insufficient documentation

## 2024-02-01 DIAGNOSIS — Z803 Family history of malignant neoplasm of breast: Secondary | ICD-10-CM | POA: Insufficient documentation

## 2024-02-01 DIAGNOSIS — Z800 Family history of malignant neoplasm of digestive organs: Secondary | ICD-10-CM | POA: Diagnosis not present

## 2024-02-01 DIAGNOSIS — C50311 Malignant neoplasm of lower-inner quadrant of right female breast: Secondary | ICD-10-CM | POA: Diagnosis not present

## 2024-02-01 DIAGNOSIS — M8580 Other specified disorders of bone density and structure, unspecified site: Secondary | ICD-10-CM | POA: Insufficient documentation

## 2024-02-01 DIAGNOSIS — Z801 Family history of malignant neoplasm of trachea, bronchus and lung: Secondary | ICD-10-CM | POA: Diagnosis not present

## 2024-02-01 DIAGNOSIS — Z79811 Long term (current) use of aromatase inhibitors: Secondary | ICD-10-CM | POA: Diagnosis not present

## 2024-02-01 DIAGNOSIS — D72829 Elevated white blood cell count, unspecified: Secondary | ICD-10-CM | POA: Diagnosis not present

## 2024-02-01 LAB — CBC WITH DIFFERENTIAL/PLATELET
Abs Immature Granulocytes: 0.05 10*3/uL (ref 0.00–0.07)
Basophils Absolute: 0.1 10*3/uL (ref 0.0–0.1)
Basophils Relative: 1 %
Eosinophils Absolute: 0.2 10*3/uL (ref 0.0–0.5)
Eosinophils Relative: 2 %
HCT: 43.6 % (ref 36.0–46.0)
Hemoglobin: 14.1 g/dL (ref 12.0–15.0)
Immature Granulocytes: 0 %
Lymphocytes Relative: 24 %
Lymphs Abs: 3.2 10*3/uL (ref 0.7–4.0)
MCH: 29.2 pg (ref 26.0–34.0)
MCHC: 32.3 g/dL (ref 30.0–36.0)
MCV: 90.3 fL (ref 80.0–100.0)
Monocytes Absolute: 0.9 10*3/uL (ref 0.1–1.0)
Monocytes Relative: 6 %
Neutro Abs: 9.2 10*3/uL — ABNORMAL HIGH (ref 1.7–7.7)
Neutrophils Relative %: 67 %
Platelets: 295 10*3/uL (ref 150–400)
RBC: 4.83 MIL/uL (ref 3.87–5.11)
RDW: 14 % (ref 11.5–15.5)
WBC: 13.6 10*3/uL — ABNORMAL HIGH (ref 4.0–10.5)
nRBC: 0 % (ref 0.0–0.2)

## 2024-02-01 LAB — COMPREHENSIVE METABOLIC PANEL
ALT: 17 U/L (ref 0–44)
AST: 17 U/L (ref 15–41)
Albumin: 4.1 g/dL (ref 3.5–5.0)
Alkaline Phosphatase: 77 U/L (ref 38–126)
Anion gap: 10 (ref 5–15)
BUN: 30 mg/dL — ABNORMAL HIGH (ref 8–23)
CO2: 27 mmol/L (ref 22–32)
Calcium: 9.5 mg/dL (ref 8.9–10.3)
Chloride: 102 mmol/L (ref 98–111)
Creatinine, Ser: 0.78 mg/dL (ref 0.44–1.00)
GFR, Estimated: >60 mL/min (ref >60)
Glucose, Bld: 99 mg/dL (ref 70–99)
Potassium: 4.5 mmol/L (ref 3.5–5.1)
Sodium: 139 mmol/L (ref 135–145)
Total Bilirubin: 0.4 mg/dL (ref 0.0–1.2)
Total Protein: 7.4 g/dL (ref 6.5–8.1)

## 2024-02-08 ENCOUNTER — Inpatient Hospital Stay: Payer: Medicare Other | Admitting: Hematology

## 2024-02-08 VITALS — BP 142/73 | HR 77 | Temp 97.4°F | Resp 16 | Wt 149.9 lb

## 2024-02-08 DIAGNOSIS — M8580 Other specified disorders of bone density and structure, unspecified site: Secondary | ICD-10-CM | POA: Diagnosis not present

## 2024-02-08 DIAGNOSIS — Z800 Family history of malignant neoplasm of digestive organs: Secondary | ICD-10-CM | POA: Diagnosis not present

## 2024-02-08 DIAGNOSIS — Z170 Estrogen receptor positive status [ER+]: Secondary | ICD-10-CM

## 2024-02-08 DIAGNOSIS — D72829 Elevated white blood cell count, unspecified: Secondary | ICD-10-CM

## 2024-02-08 DIAGNOSIS — Z79811 Long term (current) use of aromatase inhibitors: Secondary | ICD-10-CM | POA: Diagnosis not present

## 2024-02-08 DIAGNOSIS — Z803 Family history of malignant neoplasm of breast: Secondary | ICD-10-CM | POA: Diagnosis not present

## 2024-02-08 DIAGNOSIS — C50311 Malignant neoplasm of lower-inner quadrant of right female breast: Secondary | ICD-10-CM | POA: Diagnosis not present

## 2024-02-08 DIAGNOSIS — Z801 Family history of malignant neoplasm of trachea, bronchus and lung: Secondary | ICD-10-CM | POA: Diagnosis not present

## 2024-02-08 NOTE — Progress Notes (Signed)
 [facility] 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 - Right breast lumpectomy on 08/04/2021 - Pathology consistent with 0.9 cm IDC, grade 1, free margins, PT1BPNX. - XRT to the right breast in 5 fractions completed. - Anastrozole started on 08/27/2021.  2.  Social/family history: - Lives at home with her husband.  She worked as a Associate Professor prior to retirement.  Never smoker. - Younger sister had lung cancer and was a smoker.  Older sister had breast cancer.  Maternal uncle had esophageal cancer.    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 - Physical exam: No palpable masses or adenopathy. - Reviewed labs from 02/01/2024: Normal LFTs.  CBC grossly normal. - Will see her back in 6 months for follow-up.  Will schedule screening mammogram after July 05, 2024. - She has elevated white count, predominantly neutrophils for the last 4 times.  Will check BCR/ABL by FISH and JAK2 V617F with reflex testing.  She denies any systemic or topical steroid use.  No recurrent infections.  2.  Osteopenia (DEXA 11/24/2021 T-score -2): - Reviewed DEXA scan from 12/10/2023: T-score -2.1 with no significant change from prior scan in December 2022.  She still has osteopenia.  No family history or personal history of osteoporotic fractures.  Recommend continue vitamin D 1000 units daily.  Last vitamin D level  is within normal limits.  Counseled her to do weightbearing exercises.  Would hold off on any treatment at this time.  Orders Placed This Encounter  Procedures   MM 3D SCREENING MAMMOGRAM BILATERAL BREAST    Standing Status:   Future    Expected Date:   07/10/2024    Expiration Date:   02/07/2025    Reason for Exam (SYMPTOM  OR DIAGNOSIS REQUIRED):   breast cancer screening    Preferred imaging location?:   [facility]   CBC with Differential    Standing Status:   Future    Expected Date:   08/14/2024    Expiration Date:   02/07/2025   Comprehensive metabolic panel    Standing Status:   Future    Expected Date:   08/14/2024    Expiration Date:   02/07/2025   JAK2 V617F rfx CALR/MPL/E12-15    Standing Status:   Future    Expected Date:   08/14/2024    Expiration Date:   02/07/2025   BCR-ABL1 FISH    Standing Status:   Future    Expected Date:   08/14/2024    Expiration Date:   02/07/2025     [name],acting as a scribe for Erica Massed, MD.,have documented all relevant documentation on the behalf of Erica Massed, MD,as directed by  Erica Massed, MD while in the presence of Erica Massed, MD.  I, Erica Massed MD, have reviewed the above documentation for accuracy and completeness, and I agree with the above.    Erica Massed, MD  2/25/20252:22 PM  CHIEF COMPLAINT:   Diagnosis: right breast cancer     Cancer Staging  Malignant neoplasm of lower-inner quadrant of right female breast [facility]) Staging form: Breast, AJCC 8th Edition - Clinical stage from 08/26/2021: Stage IA (cT1b, cN0, cM0, G1, ER+, PR+, HER2-) - Unsigned    Prior Therapy: Right breast lumpectomy and radiation therapy  Current Therapy: Anastrozole   HISTORY OF PRESENT ILLNESS:   Oncology History   No history exists.     INTERVAL HISTORY:   [name] is a 78 y.o. female presenting to clinic today for follow up of right breast cancer. She was last seen by me on  08/12/23.  Since her last visit, she underwent a DXA scan on 12/10/23 with a T-score of -2.1, which is considered osteopenic. [name] has a family history of osteoporosis. She has a history of a stress fracture in the heel and a torn meniscus. Her sister had osteoporosis and likely rheumatoid arthritis.   Today, she states that she is doing well overall. Her appetite level is at 100%. Her energy level is at 50%. She is accompanied by her husband.  She denies any issues in the last 6 months. [name] is not on any steroids or inhalers, or recurrent infections to explain elevated WBC. She takes Vitamin D and Calcium supplements.   [name] denies any hot flashes, aches or pains due to Anastrozole.   PAST MEDICAL HISTORY:   Past Medical History: Past Medical History:  Diagnosis Date   Acid reflux    Breast cancer, right breast (HCC) 06/2021   Invasive ductal carcinoma, grade 1   HTN (hypertension)    Hypothyroidism    Thyroid disease     Surgical History: Past Surgical History:  Procedure Laterality Date   BLADDER SURGERY     BREAST LUMPECTOMY WITH RADIOFREQUENCY TAG IDENTIFICATION Right 08/04/2021   Procedure: BREAST LUMPECTOMY WITH RADIOFREQUENCY TAG IDENTIFICATION;  Surgeon: Erica Macho, MD;  Location: [facility];  Service: General;  Laterality: Right;   CHOLECYSTECTOMY     COLONOSCOPY N/A 12/27/2013   Procedure: COLONOSCOPY;  Surgeon: Erica Hippo, MD;  Location: [facility];  Service: Endoscopy;  Laterality: N/A;  1030   ESOPHAGOGASTRODUODENOSCOPY (EGD) WITH ESOPHAGEAL DILATION N/A 04/20/2013   Procedure: ESOPHAGOGASTRODUODENOSCOPY (EGD) WITH ESOPHAGEAL DILATION;  Surgeon: Erica Hippo, MD;  Location: [facility];  Service: Endoscopy;  Laterality: N/A;  300-moved to 100 Ann to notify pt   MASTECTOMY, PARTIAL Right 08/04/2021   Invasive ductal carcinoma, grade 1/Procedure: MASTECTOMY PARTIAL;  Surgeon: Erica Macho, MD;  Location: [facility];  Service: General;  Laterality: Right;    PARTIAL HYSTERECTOMY     TUBAL LIGATION     UMBILICAL HERNIA REPAIR      Social History: Social History   Socioeconomic History   Marital status: Married    Spouse name: Not on file   Number of children: Not on file   Years of education: 12   Highest education level: Not on file  Occupational History   Not on file  Tobacco Use   Smoking status: Never   Smokeless tobacco: Never  Vaping Use   Vaping status: Never Used  Substance and Sexual Activity   Alcohol use: No   Drug use: No   Sexual activity: Not Currently  Other Topics Concern   Not on file  Social History Narrative   Not on file   Social Drivers of Health   Financial Resource Strain: Low Risk  (08/27/2021)   Overall Financial  Resource Strain (CARDIA)    Difficulty of Paying Living Expenses: Not hard at all  Food Insecurity: No Food Insecurity (08/27/2021)   Hunger Vital Sign    Worried About Running Out of Food in the Last Year: Never true    Ran Out of Food in the Last Year: Never true  Transportation Needs: No Transportation Needs (08/27/2021)   PRAPARE - Administrator, Civil Service (Medical): No    Lack of Transportation (Non-Medical): No  Physical Activity: Sufficiently Active (08/27/2021)   Exercise Vital Sign    Days of Exercise per Week: 7 days    Minutes of Exercise per Session: 30 min  Stress: No Stress Concern Present (08/27/2021)   [employer] of Occupational Health - Occupational Stress Questionnaire    Feeling of Stress : Only a little  Social Connections: Moderately Integrated (08/27/2021)   Social Connection and Isolation Panel [NHANES]    Frequency of Communication with Friends and Family: More than three times a week    Frequency of Social Gatherings with Friends and Family: More than three times a week    Attends Religious Services: More than 4 times per year    Active Member of [employer] or Organizations: No    Attends Banker Meetings: Never    Marital Status:  Married  Catering manager Violence: Not At Risk (08/27/2021)   Humiliation, Afraid, Rape, and Kick questionnaire    Fear of Current or Ex-Partner: No    Emotionally Abused: No    Physically Abused: No    Sexually Abused: No    Family History: Family History  Problem Relation Age of Onset   Heart disease Mother    Emphysema Father    Breast cancer Sister    Heart disease Sister    Lung cancer Sister    Heart disease Sister    Heart disease Brother    Heart disease Brother     Current Medications:  Current Outpatient Medications:    acetaminophen (TYLENOL) 650 MG CR tablet, Take 650 mg by mouth every 8 (eight) hours as needed for pain., Disp: , Rfl:    ALPRAZolam (XANAX) 0.25 MG tablet, Take 0.125 mg by mouth at bedtime as needed for sleep., Disp: , Rfl:    anastrozole (ARIMIDEX) 1 MG tablet, TAKE 1 TABLET(1 MG) BY MOUTH DAILY, Disp: 90 tablet, Rfl: 1   aspirin EC 81 MG tablet, Take 81 mg by mouth every other day., Disp: , Rfl:    Biotin 1000 MCG tablet, Take 1,000 mcg by mouth daily., Disp: , Rfl:    Calcium Carb-Cholecalciferol (CALCIUM 500/D PO), Take by mouth., Disp: , Rfl:    calcium carbonate (TUMS - DOSED IN MG ELEMENTAL CALCIUM) 500 MG chewable tablet, Chew 500 mg by mouth daily as needed for indigestion or heartburn., Disp: , Rfl:    Cyanocobalamin (B-12 SL), Place 1 mL under the tongue 3 (three) times a week., Disp: , Rfl:    ibuprofen (ADVIL) 200 MG tablet, Take 200 mg by mouth every 6 (six) hours as needed for moderate pain., Disp: , Rfl:    levothyroxine (SYNTHROID, LEVOTHROID) 75 MCG tablet, Take 75 mcg by mouth daily before breakfast. , Disp: , Rfl:    lisinopril-hydrochlorothiazide (PRINZIDE,ZESTORETIC) 20-12.5 MG per tablet, Take 1 tablet by mouth every morning. , Disp: , Rfl:    lovastatin (MEVACOR) 20 MG tablet, Take 20 mg by mouth at bedtime., Disp: , Rfl:    Magnesium 250 MG TABS, Take 250 mg by  mouth 2 (two) times daily., Disp: , Rfl:    omeprazole (PRILOSEC)  20 MG capsule, Take 20 mg by mouth every morning. , Disp: , Rfl:    Allergies: Allergies  Allergen Reactions   Ancef [Cefazolin] Anaphylaxis   Ciprofloxacin Anaphylaxis    REVIEW OF SYSTEMS:   Review of Systems  Constitutional:  Negative for chills, fatigue and fever.  HENT:   Negative for lump/mass, mouth sores, nosebleeds, sore throat and trouble swallowing.   Eyes:  Negative for eye problems.  Respiratory:  Negative for cough and shortness of breath.   Cardiovascular:  Negative for chest pain, leg swelling and palpitations.  Gastrointestinal:  Negative for abdominal pain, constipation, diarrhea, nausea and vomiting.  Genitourinary:  Negative for bladder incontinence, difficulty urinating, dysuria, frequency, hematuria and nocturia.   Musculoskeletal:  Negative for arthralgias, back pain, flank pain, myalgias and neck pain.  Skin:  Negative for itching and rash.  Neurological:  Positive for headaches (3/10 pain severity). Negative for dizziness and numbness.  Hematological:  Does not bruise/bleed easily.  Psychiatric/Behavioral:  Negative for depression, sleep disturbance and suicidal ideas. The patient is not nervous/anxious.   All other systems reviewed and are negative.    VITALS:   Blood pressure (!) 142/73, pulse 77, temperature (!) 97.4 F (36.3 C), temperature source Oral, resp. rate 16, weight 149 lb 14.6 oz (68 kg), SpO2 97%.  Wt Readings from Last 3 Encounters:  02/08/24 149 lb 14.6 oz (68 kg)  08/12/23 150 lb 12.8 oz (68.4 kg)  02/04/23 159 lb 4.8 oz (72.3 kg)    Body mass index is 29.28 kg/m.  Performance status (ECOG): 1 - Symptomatic but completely ambulatory  PHYSICAL EXAM:   Physical Exam Vitals and nursing note reviewed. Exam conducted with a chaperone present.  Constitutional:      Appearance: Normal appearance.  Cardiovascular:     Rate and Rhythm: Normal rate and regular rhythm.     Pulses: Normal pulses.     Heart sounds: Normal heart sounds.   Pulmonary:     Effort: Pulmonary effort is normal.     Breath sounds: Normal breath sounds.  Abdominal:     Palpations: Abdomen is soft. There is no hepatomegaly, splenomegaly or mass.     Tenderness: There is no abdominal tenderness.  Musculoskeletal:     Right lower leg: No edema.     Left lower leg: No edema.  Lymphadenopathy:     Cervical: No cervical adenopathy.     Right cervical: No superficial, deep or posterior cervical adenopathy.    Left cervical: No superficial, deep or posterior cervical adenopathy.     Upper Body:     Right upper body: No supraclavicular or axillary adenopathy.     Left upper body: No supraclavicular or axillary adenopathy.  Neurological:     General: No focal deficit present.     Mental Status: She is alert and oriented to person, place, and time.  Psychiatric:        Mood and Affect: Mood normal.        Behavior: Behavior normal.   Breast Exam Chaperone: Chapman Moss, RN   LABS:      Latest Ref Rng & Units 02/01/2024    1:11 PM 08/05/2023    1:49 PM 01/28/2023    2:30 PM  CBC  WBC 4.0 - 10.5 K/uL 13.6  11.2  11.5   Hemoglobin 12.0 - 15.0 g/dL 56.2  13.0  86.5   Hematocrit 36.0 - 46.0 %  43.6  44.3  42.7   Platelets 150 - 400 K/uL 295  294  265       Latest Ref Rng & Units 02/01/2024    1:11 PM 08/05/2023    1:49 PM 01/28/2023    2:30 PM  CMP  Glucose 70 - 99 mg/dL 99  621  95   BUN 8 - 23 mg/dL 30  18  22    Creatinine 0.44 - 1.00 mg/dL 3.08  6.57  8.46   Sodium 135 - 145 mmol/L 139  139  137   Potassium 3.5 - 5.1 mmol/L 4.5  4.7  4.5   Chloride 98 - 111 mmol/L 102  103  101   CO2 22 - 32 mmol/L 27  28  26    Calcium 8.9 - 10.3 mg/dL 9.5  9.5  9.2   Total Protein 6.5 - 8.1 g/dL 7.4  7.2  7.3   Total Bilirubin 0.0 - 1.2 mg/dL 0.4  0.6  0.6   Alkaline Phos 38 - 126 U/L 77  82  85   AST 15 - 41 U/L 17  19  24    ALT 0 - 44 U/L 17  20  22       No results found for: "CEA1", "CEA" / No results found for: "CEA1", "CEA" No results  found for: "PSA1" No results found for: "NGE952" No results found for: "CAN125"  No results found for: "TOTALPROTELP", "ALBUMINELP", "A1GS", "A2GS", "BETS", "BETA2SER", "GAMS", "MSPIKE", "SPEI" No results found for: "TIBC", "FERRITIN", "IRONPCTSAT" No results found for: "LDH"   STUDIES:   No results found.

## 2024-02-08 NOTE — Patient Instructions (Addendum)
 [facility] at [facility] Discharge Instructions   You were seen and examined today by Dr. Ellin Saba.  He reviewed the results of your lab work which are normal/stable.   We will see you back in 6 months. We will repeat lab work prior to this visit. We will repeat your mammogram in July.    Continue anastrozole as prescribed.   Return as scheduled.    Thank you for choosing [facility] at [facility] to provide your oncology and hematology care.  To afford each patient quality time with our provider, please arrive at least 15 minutes before your scheduled appointment time.   If you have a lab appointment with the Cancer Center please come in thru the Main Entrance and check in at the main information desk.  You need to re-schedule your appointment should you arrive 10 or more minutes late.  We strive to give you quality time with our providers, and arriving late affects you and other patients whose appointments are after yours.  Also, if you no show three or more times for appointments you may be dismissed from the clinic at the providers discretion.     Again, thank you for choosing [facility].  Our hope is that these requests will decrease the amount of time that you wait before being seen by our physicians.       _____________________________________________________________  Should you have questions after your visit to [facility], please contact our office at [phone] and follow the prompts.  Our office hours are 8:00 a.m. and 4:30 p.m. Monday - Friday.  Please note that voicemails left after 4:00 p.m. may not be returned until the following business day.  We are closed weekends and major holidays.  You do have access to a nurse 24-7, just call the main number to the clinic [phone] and do not press any options, hold on the line and a nurse will answer the phone.    For prescription refill requests, have  your pharmacy contact our office and allow 72 hours.    Due to Covid, you will need to wear a mask upon entering the hospital. If you do not have a mask, a mask will be given to you at the Main Entrance upon arrival. For doctor visits, patients may have 1 support person age 25 or older with them. For treatment visits, patients can not have anyone with them due to social distancing guidelines and our immunocompromised population.

## 2024-02-09 ENCOUNTER — Other Ambulatory Visit: Payer: Self-pay | Admitting: Hematology

## 2024-02-17 DIAGNOSIS — L648 Other androgenic alopecia: Secondary | ICD-10-CM | POA: Diagnosis not present

## 2024-02-17 DIAGNOSIS — D225 Melanocytic nevi of trunk: Secondary | ICD-10-CM | POA: Diagnosis not present

## 2024-02-17 DIAGNOSIS — L820 Inflamed seborrheic keratosis: Secondary | ICD-10-CM | POA: Diagnosis not present

## 2024-03-29 DIAGNOSIS — I10 Essential (primary) hypertension: Secondary | ICD-10-CM | POA: Diagnosis not present

## 2024-03-29 DIAGNOSIS — M542 Cervicalgia: Secondary | ICD-10-CM | POA: Diagnosis not present

## 2024-07-07 ENCOUNTER — Ambulatory Visit (HOSPITAL_COMMUNITY)
Admission: RE | Admit: 2024-07-07 | Discharge: 2024-07-07 | Disposition: A | Discharge status: Home / Self Care | Payer: Medicare Other | Source: Ambulatory Visit | Attending: Hematology | Admitting: Hematology

## 2024-07-07 ENCOUNTER — Encounter (HOSPITAL_COMMUNITY): Payer: Self-pay

## 2024-07-07 ENCOUNTER — Inpatient Hospital Stay: Payer: Medicare Other | Attending: Oncology

## 2024-07-07 DIAGNOSIS — Z170 Estrogen receptor positive status [ER+]: Secondary | ICD-10-CM

## 2024-07-07 DIAGNOSIS — D72829 Elevated white blood cell count, unspecified: Secondary | ICD-10-CM | POA: Insufficient documentation

## 2024-07-07 DIAGNOSIS — Z853 Personal history of malignant neoplasm of breast: Secondary | ICD-10-CM | POA: Diagnosis not present

## 2024-07-07 DIAGNOSIS — C50311 Malignant neoplasm of lower-inner quadrant of right female breast: Secondary | ICD-10-CM | POA: Diagnosis not present

## 2024-07-07 DIAGNOSIS — Z1231 Encounter for screening mammogram for malignant neoplasm of breast: Secondary | ICD-10-CM | POA: Insufficient documentation

## 2024-07-07 LAB — CBC WITH DIFFERENTIAL/PLATELET
Abs Immature Granulocytes: 0.08 K/uL — ABNORMAL HIGH (ref 0.00–0.07)
Basophils Absolute: 0.1 K/uL (ref 0.0–0.1)
Basophils Relative: 1 %
Eosinophils Absolute: 0.2 K/uL (ref 0.0–0.5)
Eosinophils Relative: 2 %
HCT: 44.1 % (ref 36.0–46.0)
Hemoglobin: 14.3 g/dL (ref 12.0–15.0)
Immature Granulocytes: 1 %
Lymphocytes Relative: 21 %
Lymphs Abs: 2.3 K/uL (ref 0.7–4.0)
MCH: 28.8 pg (ref 26.0–34.0)
MCHC: 32.4 g/dL (ref 30.0–36.0)
MCV: 88.9 fL (ref 80.0–100.0)
Monocytes Absolute: 0.6 K/uL (ref 0.1–1.0)
Monocytes Relative: 6 %
Neutro Abs: 7.5 K/uL (ref 1.7–7.7)
Neutrophils Relative %: 69 %
Platelets: ADEQUATE K/uL (ref 150–400)
RBC: 4.96 MIL/uL (ref 3.87–5.11)
RDW: 13.7 % (ref 11.5–15.5)
WBC: 10.8 K/uL — ABNORMAL HIGH (ref 4.0–10.5)
nRBC: 0 % (ref 0.0–0.2)

## 2024-07-07 LAB — COMPREHENSIVE METABOLIC PANEL WITH GFR
ALT: 20 U/L (ref 0–44)
AST: 24 U/L (ref 15–41)
Albumin: 3.7 g/dL (ref 3.5–5.0)
Alkaline Phosphatase: 89 U/L (ref 38–126)
Anion gap: 10 (ref 5–15)
BUN: 26 mg/dL — ABNORMAL HIGH (ref 8–23)
CO2: 25 mmol/L (ref 22–32)
Calcium: 9.4 mg/dL (ref 8.9–10.3)
Chloride: 105 mmol/L (ref 98–111)
Creatinine, Ser: 0.74 mg/dL (ref 0.44–1.00)
GFR, Estimated: >60 mL/min (ref >60)
Glucose, Bld: 103 mg/dL — ABNORMAL HIGH (ref 70–99)
Potassium: 4.1 mmol/L (ref 3.5–5.1)
Sodium: 140 mmol/L (ref 135–145)
Total Bilirubin: 0.8 mg/dL (ref 0.0–1.2)
Total Protein: 7.4 g/dL (ref 6.5–8.1)

## 2024-07-13 LAB — BCR-ABL1 FISH
Cells Analyzed: 200
Cells Counted: 200

## 2024-07-15 LAB — JAK2 V617F RFX CALR/MPL/E12-15

## 2024-07-15 LAB — CALR +MPL + E12-E15  (REFLEX)

## 2024-07-24 ENCOUNTER — Inpatient Hospital Stay: Payer: Medicare Other | Admitting: Oncology

## 2024-07-25 ENCOUNTER — Inpatient Hospital Stay: Attending: Hematology | Admitting: Oncology

## 2024-07-25 DIAGNOSIS — M8580 Other specified disorders of bone density and structure, unspecified site: Secondary | ICD-10-CM | POA: Diagnosis not present

## 2024-07-25 DIAGNOSIS — Z79811 Long term (current) use of aromatase inhibitors: Secondary | ICD-10-CM | POA: Insufficient documentation

## 2024-07-25 DIAGNOSIS — D72829 Elevated white blood cell count, unspecified: Secondary | ICD-10-CM | POA: Diagnosis not present

## 2024-07-25 DIAGNOSIS — Z170 Estrogen receptor positive status [ER+]: Secondary | ICD-10-CM | POA: Insufficient documentation

## 2024-07-25 DIAGNOSIS — Z803 Family history of malignant neoplasm of breast: Secondary | ICD-10-CM | POA: Diagnosis not present

## 2024-07-25 DIAGNOSIS — C50311 Malignant neoplasm of lower-inner quadrant of right female breast: Secondary | ICD-10-CM | POA: Diagnosis not present

## 2024-07-25 NOTE — Progress Notes (Signed)
 [facility] 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 - Right breast lumpectomy on 08/04/2021 - Pathology consistent with 0.9 cm IDC, grade 1, free margins, PT1BPNX. - XRT to the right breast in 5 fractions completed. - Anastrozole  started on 08/27/2021.  2.  Social/family history: - Lives at home with her husband.  She worked as a Associate Professor prior to retirement.  Never smoker. - Younger sister had lung cancer and was a smoker.  Older sister had breast cancer.  Maternal uncle had esophageal cancer.    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 - Physical exam: No palpable masses or adenopathy. - Reviewed labs from 07/07/2024: Normal LFTs.  CBC grossly normal. - Will see her back in 6 months for follow-up.   -Most recent mammogram from 07/07/2024 was BI-RADS Category 1 negative. -Repeat mammogram in 1 year.  Return to clinic in 6 months with labs a few days before.  Continue anastrozole .  2.  Osteopenia (DEXA 11/24/2021 T-score -2): - Reviewed DEXA scan from 12/10/2023: T-score -2.1 with no significant change from prior scan in December 2022.  She still has osteopenia.  No family history or personal history of osteoporotic fractures.  Recommend continue vitamin D  1000 units daily.  Last vitamin D  level is within normal limits.  Counseled her to do weightbearing exercises.  Would  hold off on any treatment at this time.  3.  Elevated white blood cell count: -Patient had elevated white count over the last few lab draws.  At her last visit, Dr. Rogers recommended additional testing which included BCR able with FISH and JAK2 with reflex.  Both lab tests were within normal limits. -She continues to deny any recurrent infections. -Most recent white count has improved.  Orders Placed This Encounter  Procedures   CBC with Differential    Standing Status:   Future    Expected Date:   01/25/2025    Expiration Date:   07/25/2025   Comprehensive metabolic panel    Standing Status:   Future    Expected Date:   01/25/2025    Expiration Date:   07/25/2025    Delon FORBES Hope, NP   8/12/20253:01 PM  CHIEF COMPLAINT:   Diagnosis: right breast cancer     Cancer Staging  Malignant neoplasm of lower-inner quadrant of right female breast [facility]) Staging form: Breast, AJCC 8th Edition - Clinical stage from 08/26/2021: Stage IA (cT1b, cN0, cM0, G1, ER+, PR+, HER2-) - Unsigned    Prior Therapy: Right breast lumpectomy and radiation therapy  Current Therapy: Anastrozole    HISTORY OF PRESENT ILLNESS:   Oncology History   No history exists.     INTERVAL HISTORY:   [name] is a 78 y.o. female presenting to clinic today for follow up of right breast cancer.   Since her last visit, she had a mammogram on 07/07/2024 which negative.  Previously had a bone density on 12/10/2023 which  revealed a T-score of -2.1.  She was encouraged weightbearing exercises and to continue calcium and vitamin D .  She also was found to have an elevated white count at her last visit.  Additional testing was done on the most recent lab drawl and she is here to review those results.  Today, she states that she is doing well overall. Her appetite level is at 100%. Her energy level is at 75%. She is alone today.  She denies any issues in the last 6 months. [name] is not on any steroids or inhalers, or  recurrent infections to explain elevated WBC. She takes Vitamin D  and Calcium supplements.   [name] denies any hot flashes, aches or pains due to Anastrozole .   PAST MEDICAL HISTORY:   Past Medical History: Past Medical History:  Diagnosis Date   Acid reflux    Breast cancer, right breast (HCC) 06/2021   Invasive ductal carcinoma, grade 1   HTN (hypertension)    Hypothyroidism    Thyroid  disease     Surgical History: Past Surgical History:  Procedure Laterality Date   BLADDER SURGERY     BREAST LUMPECTOMY WITH RADIOFREQUENCY TAG IDENTIFICATION Right 08/04/2021   Procedure: BREAST LUMPECTOMY WITH RADIOFREQUENCY TAG IDENTIFICATION;  Surgeon: Mavis Anes, MD;  Location: [facility];  Service: General;  Laterality: Right;   CHOLECYSTECTOMY     COLONOSCOPY N/A 12/27/2013   Procedure: COLONOSCOPY;  Surgeon: Claudis RAYMOND Rivet, MD;  Location: [facility];  Service: Endoscopy;  Laterality: N/A;  1030   ESOPHAGOGASTRODUODENOSCOPY (EGD) WITH ESOPHAGEAL DILATION N/A 04/20/2013   Procedure: ESOPHAGOGASTRODUODENOSCOPY (EGD) WITH ESOPHAGEAL DILATION;  Surgeon: Claudis RAYMOND Rivet, MD;  Location: [facility];  Service: Endoscopy;  Laterality: N/A;  300-moved to 100 Ann to notify pt   MASTECTOMY, PARTIAL Right 08/04/2021   Invasive ductal carcinoma, grade 1/Procedure: MASTECTOMY PARTIAL;  Surgeon: Mavis Anes, MD;  Location: [facility];  Service: General;  Laterality: Right;   PARTIAL HYSTERECTOMY     TUBAL LIGATION     UMBILICAL HERNIA REPAIR      Social History: Social History   Socioeconomic History   Marital status: Married    Spouse name: Not on file   Number of children: Not on file   Years of education: 12   Highest education level: Not on file  Occupational History   Not on file  Tobacco Use   Smoking status: Never   Smokeless tobacco: Never  Vaping Use   Vaping status: Never Used  Substance and Sexual Activity   Alcohol use: No   Drug use: No   Sexual activity: Not Currently   Other Topics Concern   Not on file  Social History Narrative   Not on file   Social Drivers of Health   Financial Resource Strain: Low Risk  (08/27/2021)   Overall Financial Resource Strain (CARDIA)    Difficulty of Paying Living Expenses: Not hard at all  Food Insecurity: No Food Insecurity (08/27/2021)   Hunger Vital Sign    Worried About Running Out of Food in the Last Year: Never true    Ran Out of Food in the Last Year: Never true  Transportation Needs: No Transportation Needs (08/27/2021)   PRAPARE - Administrator, Civil Service (Medical): No    Lack of Transportation (Non-Medical): No  Physical Activity: Sufficiently Active (08/27/2021)   Exercise Vital Sign    Days of Exercise per Week: 7 days    Minutes of Exercise per Session: 30 min  Stress: No Stress Concern Present (08/27/2021)   [employer] of Occupational Health - Occupational Stress Questionnaire    Feeling of Stress : Only a little  Social Connections: Moderately Integrated (08/27/2021)   Social Connection and Isolation Panel    Frequency of Communication with Friends and Family: More than three times a week    Frequency of Social Gatherings with Friends and Family: More than three times a week    Attends Religious Services: More than 4 times per year    Active Member of [employer] or Organizations: No    Attends Banker Meetings: Never    Marital Status: Married  Catering manager Violence: Not At Risk (08/27/2021)   Humiliation, Afraid, Rape, and Kick questionnaire    Fear of Current or Ex-Partner: No    Emotionally Abused: No    Physically Abused: No    Sexually Abused: No    Family History: Family History  Problem Relation Age of Onset   Heart disease Mother    Emphysema Father    Breast cancer Sister    Heart disease Sister    Lung cancer Sister    Heart disease Sister    Heart disease Brother    Heart disease Brother     Current Medications:  Current Outpatient  Medications:    acetaminophen (TYLENOL) 650 MG CR tablet, Take 650 mg by mouth every 8 (eight) hours as needed for pain., Disp: , Rfl:    ALPRAZolam (XANAX) 0.25 MG tablet, Take 0.125 mg by mouth at bedtime as needed for sleep., Disp: , Rfl:    anastrozole (ARIMIDEX) 1 MG tablet, TAKE 1 TABLET(1 MG) BY MOUTH DAILY, Disp: 90 tablet, Rfl: 1   aspirin EC 81 MG tablet, Take 81 mg by mouth every other day., Disp: , Rfl:    Biotin 1000 MCG tablet, Take 1,000 mcg by mouth daily., Disp: , Rfl:    Calcium Carb-Cholecalciferol (CALCIUM 500/D PO), Take by mouth., Disp: , Rfl:    calcium carbonate (TUMS - DOSED IN MG ELEMENTAL CALCIUM) 500 MG chewable tablet, Chew 500 mg by mouth daily as needed for indigestion or heartburn., Disp: , Rfl:    Cyanocobalamin (B-12 SL), Place 1 mL under the tongue 3 (three) times a week., Disp: , Rfl:    ibuprofen (ADVIL) 200 MG tablet, Take 200 mg by mouth every 6 (six) hours as needed for moderate pain., Disp: , Rfl:    levothyroxine (SYNTHROID, LEVOTHROID) 75 MCG tablet, Take 75 mcg by mouth daily before breakfast. , Disp: , Rfl:    lisinopril-hydrochlorothiazide (PRINZIDE,ZESTORETIC) 20-12.5 MG per tablet, Take 1 tablet by mouth every morning. , Disp: , Rfl:    lovastatin (MEVACOR) 20 MG tablet, Take 20 mg by mouth at bedtime., Disp: , Rfl:    Magnesium 250 MG TABS, Take 250 mg by mouth 2 (two) times daily., Disp: , Rfl:    omeprazole (PRILOSEC) 20 MG capsule, Take 20 mg by mouth every morning. , Disp: , Rfl:    Allergies: Allergies  Allergen Reactions   Ancef [Cefazolin] Anaphylaxis   Ciprofloxacin Anaphylaxis    REVIEW OF SYSTEMS:   Review of Systems  Constitutional:  Positive for fatigue.  Cardiovascular:  Positive for palpitations.  Neurological:  Positive for headaches.     VITALS:   Blood pressure (!) 128/57, pulse 92, temperature 99 F (37.2 C), temperature source Tympanic, resp. rate 20, weight 147 lb 11.3 oz (67 kg), SpO2 98%.  Wt Readings from Last  3 Encounters:  07/25/24  147 lb 11.3 oz (67 kg)  02/08/24 149 lb 14.6 oz (68 kg)  08/12/23 150 lb 12.8 oz (68.4 kg)    Body mass index is 28.85 kg/m.  Performance status (ECOG): 1 - Symptomatic but completely ambulatory  PHYSICAL EXAM:   Physical Exam Constitutional:      Appearance: Normal appearance.  Cardiovascular:     Rate and Rhythm: Normal rate and regular rhythm.  Pulmonary:     Effort: Pulmonary effort is normal.     Breath sounds: Normal breath sounds.  Chest:     Chest wall: No swelling or tenderness.  Breasts:    Right: Normal.     Left: Normal.  Abdominal:     General: Bowel sounds are normal.     Palpations: Abdomen is soft.  Musculoskeletal:        General: No swelling. Normal range of motion.  Lymphadenopathy:     Upper Body:     Right upper body: No supraclavicular or axillary adenopathy.     Left upper body: No supraclavicular or axillary adenopathy.  Neurological:     Mental Status: She is alert and oriented to person, place, and time. Mental status is at baseline.     LABS:      Latest Ref Rng & Units 07/07/2024   10:25 AM 02/01/2024    1:11 PM 08/05/2023    1:49 PM  CBC  WBC 4.0 - 10.5 K/uL 10.8  13.6  11.2   Hemoglobin 12.0 - 15.0 g/dL 85.6  85.8  85.7   Hematocrit 36.0 - 46.0 % 44.1  43.6  44.3   Platelets 150 - 400 K/uL PLATELET CLUMPS NOTED ON SMEAR, COUNT APPEARS ADEQUATE  295  294       Latest Ref Rng & Units 07/07/2024   10:25 AM 02/01/2024    1:11 PM 08/05/2023    1:49 PM  CMP  Glucose 70 - 99 mg/dL 896  99  893   BUN 8 - 23 mg/dL 26  30  18    Creatinine 0.44 - 1.00 mg/dL 9.25  9.21  9.07   Sodium 135 - 145 mmol/L 140  139  139   Potassium 3.5 - 5.1 mmol/L 4.1  4.5  4.7   Chloride 98 - 111 mmol/L 105  102  103   CO2 22 - 32 mmol/L 25  27  28    Calcium 8.9 - 10.3 mg/dL 9.4  9.5  9.5   Total Protein 6.5 - 8.1 g/dL 7.4  7.4  7.2   Total Bilirubin 0.0 - 1.2 mg/dL 0.8  0.4  0.6   Alkaline Phos 38 - 126 U/L 89  77  82   AST 15 - 41  U/L 24  17  19    ALT 0 - 44 U/L 20  17  20       No results found for: CEA1, CEA / No results found for: CEA1, CEA No results found for: PSA1 No results found for: CAN199 No results found for: CAN125  No results found for: TOTALPROTELP, ALBUMINELP, A1GS, A2GS, BETS, BETA2SER, GAMS, MSPIKE, SPEI No results found for: TIBC, FERRITIN, IRONPCTSAT No results found for: LDH   STUDIES:   MM 3D SCREENING MAMMOGRAM BILATERAL BREAST Result Date: 07/11/2024 CLINICAL DATA:  Screening. EXAM: DIGITAL SCREENING BILATERAL MAMMOGRAM WITH TOMOSYNTHESIS AND CAD TECHNIQUE: Bilateral screening digital craniocaudal and mediolateral oblique mammograms were obtained. Bilateral screening digital breast tomosynthesis was performed. The images were evaluated with computer-aided detection. COMPARISON:  Previous exam(s). ACR Breast  Density Category a: The breasts are almost entirely fatty. FINDINGS: There are no findings suspicious for malignancy. IMPRESSION: No mammographic evidence of malignancy. A result letter of this screening mammogram will be mailed directly to the patient. RECOMMENDATION: Screening mammogram in one year. (Code:SM-B-01Y) BI-RADS CATEGORY  1: Negative. Electronically Signed   By: Toribio Agreste M.D.   On: 07/11/2024 15:56

## 2024-08-07 ENCOUNTER — Other Ambulatory Visit: Payer: Self-pay | Admitting: *Deleted

## 2024-08-07 MED ORDER — ANASTROZOLE 1 MG PO TABS: 1.0000 mg | ORAL_TABLET | Freq: Every day | ORAL | 3 refills | Status: AC

## 2024-09-28 DIAGNOSIS — M1990 Unspecified osteoarthritis, unspecified site: Secondary | ICD-10-CM | POA: Diagnosis not present

## 2024-09-28 DIAGNOSIS — E039 Hypothyroidism, unspecified: Secondary | ICD-10-CM | POA: Diagnosis not present

## 2024-09-28 DIAGNOSIS — I10 Essential (primary) hypertension: Secondary | ICD-10-CM | POA: Diagnosis not present

## 2024-09-28 DIAGNOSIS — E785 Hyperlipidemia, unspecified: Secondary | ICD-10-CM | POA: Diagnosis not present

## 2024-09-28 DIAGNOSIS — C50919 Malignant neoplasm of unspecified site of unspecified female breast: Secondary | ICD-10-CM | POA: Diagnosis not present

## 2024-10-05 ENCOUNTER — Other Ambulatory Visit (HOSPITAL_COMMUNITY): Payer: Self-pay | Admitting: Internal Medicine

## 2024-10-05 DIAGNOSIS — K219 Gastro-esophageal reflux disease without esophagitis: Secondary | ICD-10-CM | POA: Diagnosis not present

## 2024-10-05 DIAGNOSIS — M542 Cervicalgia: Secondary | ICD-10-CM

## 2024-10-05 DIAGNOSIS — I10 Essential (primary) hypertension: Secondary | ICD-10-CM | POA: Diagnosis not present

## 2024-10-05 DIAGNOSIS — E785 Hyperlipidemia, unspecified: Secondary | ICD-10-CM | POA: Diagnosis not present

## 2024-10-05 DIAGNOSIS — Z853 Personal history of malignant neoplasm of breast: Secondary | ICD-10-CM | POA: Diagnosis not present

## 2024-10-05 DIAGNOSIS — E030 Congenital hypothyroidism with diffuse goiter: Secondary | ICD-10-CM | POA: Diagnosis not present

## 2024-10-05 DIAGNOSIS — I471 Supraventricular tachycardia, unspecified: Secondary | ICD-10-CM | POA: Diagnosis not present

## 2024-10-05 DIAGNOSIS — Z0001 Encounter for general adult medical examination with abnormal findings: Secondary | ICD-10-CM | POA: Diagnosis not present

## 2025-01-26 ENCOUNTER — Inpatient Hospital Stay

## 2025-02-02 ENCOUNTER — Inpatient Hospital Stay: Admitting: Oncology

## 2025-02-02 ENCOUNTER — Ambulatory Visit: Admitting: Oncology
